# Patient Record
Sex: Male | Born: 1955
Health system: Southern US, Community
[De-identification: ages and names within clinical notes are randomized; demographics above are authoritative.]

## PROBLEM LIST (undated history)

## (undated) DIAGNOSIS — J449 Chronic obstructive pulmonary disease, unspecified: Secondary | ICD-10-CM

## (undated) DIAGNOSIS — E7800 Pure hypercholesterolemia, unspecified: Secondary | ICD-10-CM

## (undated) DIAGNOSIS — I10 Essential (primary) hypertension: Secondary | ICD-10-CM

## (undated) HISTORY — DX: Chronic obstructive pulmonary disease, unspecified: J44.9

## (undated) HISTORY — DX: Pure hypercholesterolemia, unspecified: E78.00

## (undated) HISTORY — PX: TONSILLECTOMY: SUR1361

## (undated) HISTORY — DX: Essential (primary) hypertension: I10

---

## 2012-06-07 ENCOUNTER — Institutional Professional Consult (permissible substitution): Payer: Self-pay | Admitting: Internal Medicine

## 2015-11-24 DIAGNOSIS — D122 Benign neoplasm of ascending colon: Secondary | ICD-10-CM | POA: Diagnosis not present

## 2015-11-24 DIAGNOSIS — Z86010 Personal history of colonic polyps: Secondary | ICD-10-CM | POA: Diagnosis not present

## 2015-11-24 DIAGNOSIS — K621 Rectal polyp: Secondary | ICD-10-CM | POA: Diagnosis not present

## 2016-08-12 DIAGNOSIS — Z720 Tobacco use: Secondary | ICD-10-CM | POA: Diagnosis not present

## 2016-08-12 DIAGNOSIS — H53133 Sudden visual loss, bilateral: Secondary | ICD-10-CM | POA: Diagnosis not present

## 2016-08-12 DIAGNOSIS — I10 Essential (primary) hypertension: Secondary | ICD-10-CM | POA: Diagnosis not present

## 2016-08-23 DIAGNOSIS — H53133 Sudden visual loss, bilateral: Secondary | ICD-10-CM | POA: Diagnosis not present

## 2016-08-23 DIAGNOSIS — I6522 Occlusion and stenosis of left carotid artery: Secondary | ICD-10-CM | POA: Diagnosis not present

## 2016-08-23 DIAGNOSIS — I6523 Occlusion and stenosis of bilateral carotid arteries: Secondary | ICD-10-CM | POA: Diagnosis not present

## 2016-08-24 DIAGNOSIS — Z0100 Encounter for examination of eyes and vision without abnormal findings: Secondary | ICD-10-CM | POA: Diagnosis not present

## 2016-09-12 DIAGNOSIS — I10 Essential (primary) hypertension: Secondary | ICD-10-CM | POA: Diagnosis not present

## 2016-09-12 DIAGNOSIS — Z720 Tobacco use: Secondary | ICD-10-CM | POA: Diagnosis not present

## 2016-09-12 DIAGNOSIS — G4700 Insomnia, unspecified: Secondary | ICD-10-CM | POA: Diagnosis not present

## 2017-05-08 DIAGNOSIS — F4320 Adjustment disorder, unspecified: Secondary | ICD-10-CM | POA: Diagnosis not present

## 2017-05-08 DIAGNOSIS — Z6821 Body mass index (BMI) 21.0-21.9, adult: Secondary | ICD-10-CM | POA: Diagnosis not present

## 2017-10-03 DIAGNOSIS — S300XXA Contusion of lower back and pelvis, initial encounter: Secondary | ICD-10-CM | POA: Diagnosis not present

## 2017-10-03 DIAGNOSIS — Z6821 Body mass index (BMI) 21.0-21.9, adult: Secondary | ICD-10-CM | POA: Diagnosis not present

## 2017-10-25 DIAGNOSIS — Z1211 Encounter for screening for malignant neoplasm of colon: Secondary | ICD-10-CM | POA: Diagnosis not present

## 2017-10-25 DIAGNOSIS — Z0000 Encounter for general adult medical examination without abnormal findings: Secondary | ICD-10-CM | POA: Diagnosis not present

## 2017-10-25 DIAGNOSIS — Z1339 Encounter for screening examination for other mental health and behavioral disorders: Secondary | ICD-10-CM | POA: Diagnosis not present

## 2017-10-25 DIAGNOSIS — Z6821 Body mass index (BMI) 21.0-21.9, adult: Secondary | ICD-10-CM | POA: Diagnosis not present

## 2017-10-25 DIAGNOSIS — Z13220 Encounter for screening for lipoid disorders: Secondary | ICD-10-CM | POA: Diagnosis not present

## 2017-10-25 DIAGNOSIS — Z125 Encounter for screening for malignant neoplasm of prostate: Secondary | ICD-10-CM | POA: Diagnosis not present

## 2017-10-25 DIAGNOSIS — Z720 Tobacco use: Secondary | ICD-10-CM | POA: Diagnosis not present

## 2018-02-08 DIAGNOSIS — S039XXA Sprain of joints and ligaments of unspecified parts of head, initial encounter: Secondary | ICD-10-CM | POA: Diagnosis not present

## 2018-02-08 DIAGNOSIS — S7011XD Contusion of right thigh, subsequent encounter: Secondary | ICD-10-CM | POA: Diagnosis not present

## 2018-04-02 DIAGNOSIS — R0600 Dyspnea, unspecified: Secondary | ICD-10-CM | POA: Diagnosis not present

## 2018-04-02 DIAGNOSIS — R918 Other nonspecific abnormal finding of lung field: Secondary | ICD-10-CM | POA: Diagnosis not present

## 2018-04-02 DIAGNOSIS — J439 Emphysema, unspecified: Secondary | ICD-10-CM | POA: Diagnosis not present

## 2018-05-04 DIAGNOSIS — J449 Chronic obstructive pulmonary disease, unspecified: Secondary | ICD-10-CM | POA: Diagnosis not present

## 2018-06-12 DIAGNOSIS — L259 Unspecified contact dermatitis, unspecified cause: Secondary | ICD-10-CM | POA: Diagnosis not present

## 2018-07-10 DIAGNOSIS — L300 Nummular dermatitis: Secondary | ICD-10-CM | POA: Diagnosis not present

## 2018-07-10 DIAGNOSIS — J449 Chronic obstructive pulmonary disease, unspecified: Secondary | ICD-10-CM | POA: Diagnosis not present

## 2018-07-10 DIAGNOSIS — Z6821 Body mass index (BMI) 21.0-21.9, adult: Secondary | ICD-10-CM | POA: Diagnosis not present

## 2018-07-10 DIAGNOSIS — M25551 Pain in right hip: Secondary | ICD-10-CM | POA: Diagnosis not present

## 2018-11-01 DIAGNOSIS — J449 Chronic obstructive pulmonary disease, unspecified: Secondary | ICD-10-CM | POA: Diagnosis not present

## 2018-11-01 DIAGNOSIS — Z125 Encounter for screening for malignant neoplasm of prostate: Secondary | ICD-10-CM | POA: Diagnosis not present

## 2018-11-01 DIAGNOSIS — I10 Essential (primary) hypertension: Secondary | ICD-10-CM | POA: Diagnosis not present

## 2018-11-01 DIAGNOSIS — Z79899 Other long term (current) drug therapy: Secondary | ICD-10-CM | POA: Diagnosis not present

## 2018-11-01 DIAGNOSIS — E7800 Pure hypercholesterolemia, unspecified: Secondary | ICD-10-CM | POA: Diagnosis not present

## 2018-11-01 DIAGNOSIS — Z6821 Body mass index (BMI) 21.0-21.9, adult: Secondary | ICD-10-CM | POA: Diagnosis not present

## 2019-03-11 DIAGNOSIS — I10 Essential (primary) hypertension: Secondary | ICD-10-CM | POA: Diagnosis not present

## 2019-03-11 DIAGNOSIS — R42 Dizziness and giddiness: Secondary | ICD-10-CM | POA: Diagnosis not present

## 2019-03-11 DIAGNOSIS — R634 Abnormal weight loss: Secondary | ICD-10-CM | POA: Diagnosis not present

## 2019-03-11 DIAGNOSIS — Z6820 Body mass index (BMI) 20.0-20.9, adult: Secondary | ICD-10-CM | POA: Diagnosis not present

## 2019-04-03 DIAGNOSIS — K5730 Diverticulosis of large intestine without perforation or abscess without bleeding: Secondary | ICD-10-CM | POA: Diagnosis not present

## 2019-04-09 DIAGNOSIS — Z86010 Personal history of colonic polyps: Secondary | ICD-10-CM | POA: Diagnosis not present

## 2019-04-09 DIAGNOSIS — D127 Benign neoplasm of rectosigmoid junction: Secondary | ICD-10-CM | POA: Diagnosis not present

## 2019-04-09 DIAGNOSIS — K635 Polyp of colon: Secondary | ICD-10-CM | POA: Diagnosis not present

## 2019-04-09 DIAGNOSIS — K621 Rectal polyp: Secondary | ICD-10-CM | POA: Diagnosis not present

## 2019-04-09 DIAGNOSIS — D124 Benign neoplasm of descending colon: Secondary | ICD-10-CM | POA: Diagnosis not present

## 2019-04-09 DIAGNOSIS — K5730 Diverticulosis of large intestine without perforation or abscess without bleeding: Secondary | ICD-10-CM | POA: Diagnosis not present

## 2019-04-09 DIAGNOSIS — K648 Other hemorrhoids: Secondary | ICD-10-CM | POA: Diagnosis not present

## 2019-04-09 DIAGNOSIS — F17210 Nicotine dependence, cigarettes, uncomplicated: Secondary | ICD-10-CM | POA: Diagnosis not present

## 2019-04-09 DIAGNOSIS — Z79899 Other long term (current) drug therapy: Secondary | ICD-10-CM | POA: Diagnosis not present

## 2019-06-17 DIAGNOSIS — H905 Unspecified sensorineural hearing loss: Secondary | ICD-10-CM | POA: Diagnosis not present

## 2019-06-17 DIAGNOSIS — H9190 Unspecified hearing loss, unspecified ear: Secondary | ICD-10-CM | POA: Diagnosis not present

## 2019-06-17 DIAGNOSIS — Z77122 Contact with and (suspected) exposure to noise: Secondary | ICD-10-CM | POA: Diagnosis not present

## 2019-06-17 DIAGNOSIS — H903 Sensorineural hearing loss, bilateral: Secondary | ICD-10-CM | POA: Diagnosis not present

## 2019-06-17 DIAGNOSIS — H9319 Tinnitus, unspecified ear: Secondary | ICD-10-CM | POA: Diagnosis not present

## 2019-07-15 DIAGNOSIS — H9319 Tinnitus, unspecified ear: Secondary | ICD-10-CM | POA: Diagnosis not present

## 2019-07-15 DIAGNOSIS — H903 Sensorineural hearing loss, bilateral: Secondary | ICD-10-CM | POA: Diagnosis not present

## 2019-07-15 DIAGNOSIS — Z77122 Contact with and (suspected) exposure to noise: Secondary | ICD-10-CM | POA: Diagnosis not present

## 2019-09-09 DIAGNOSIS — Z6821 Body mass index (BMI) 21.0-21.9, adult: Secondary | ICD-10-CM | POA: Diagnosis not present

## 2019-09-09 DIAGNOSIS — S92001A Unspecified fracture of right calcaneus, initial encounter for closed fracture: Secondary | ICD-10-CM | POA: Diagnosis not present

## 2019-09-10 DIAGNOSIS — S92014A Nondisplaced fracture of body of right calcaneus, initial encounter for closed fracture: Secondary | ICD-10-CM | POA: Diagnosis not present

## 2019-09-18 DIAGNOSIS — S92014D Nondisplaced fracture of body of right calcaneus, subsequent encounter for fracture with routine healing: Secondary | ICD-10-CM | POA: Diagnosis not present

## 2019-09-25 DIAGNOSIS — S92014D Nondisplaced fracture of body of right calcaneus, subsequent encounter for fracture with routine healing: Secondary | ICD-10-CM | POA: Diagnosis not present

## 2019-10-22 DIAGNOSIS — S92014D Nondisplaced fracture of body of right calcaneus, subsequent encounter for fracture with routine healing: Secondary | ICD-10-CM | POA: Diagnosis not present

## 2019-11-06 DIAGNOSIS — S92014D Nondisplaced fracture of body of right calcaneus, subsequent encounter for fracture with routine healing: Secondary | ICD-10-CM | POA: Diagnosis not present

## 2019-11-07 DIAGNOSIS — J449 Chronic obstructive pulmonary disease, unspecified: Secondary | ICD-10-CM | POA: Diagnosis not present

## 2019-11-07 DIAGNOSIS — Z125 Encounter for screening for malignant neoplasm of prostate: Secondary | ICD-10-CM | POA: Diagnosis not present

## 2019-11-07 DIAGNOSIS — I10 Essential (primary) hypertension: Secondary | ICD-10-CM | POA: Diagnosis not present

## 2019-11-07 DIAGNOSIS — Z0000 Encounter for general adult medical examination without abnormal findings: Secondary | ICD-10-CM | POA: Diagnosis not present

## 2019-11-07 DIAGNOSIS — Z13220 Encounter for screening for lipoid disorders: Secondary | ICD-10-CM | POA: Diagnosis not present

## 2019-11-07 DIAGNOSIS — E7800 Pure hypercholesterolemia, unspecified: Secondary | ICD-10-CM | POA: Diagnosis not present

## 2019-12-04 DIAGNOSIS — M722 Plantar fascial fibromatosis: Secondary | ICD-10-CM | POA: Diagnosis not present

## 2019-12-04 DIAGNOSIS — S92014D Nondisplaced fracture of body of right calcaneus, subsequent encounter for fracture with routine healing: Secondary | ICD-10-CM | POA: Diagnosis not present

## 2020-01-08 DIAGNOSIS — S92014D Nondisplaced fracture of body of right calcaneus, subsequent encounter for fracture with routine healing: Secondary | ICD-10-CM | POA: Diagnosis not present

## 2020-01-22 DIAGNOSIS — M722 Plantar fascial fibromatosis: Secondary | ICD-10-CM | POA: Diagnosis not present

## 2020-01-22 DIAGNOSIS — S92014D Nondisplaced fracture of body of right calcaneus, subsequent encounter for fracture with routine healing: Secondary | ICD-10-CM | POA: Diagnosis not present

## 2020-07-31 DIAGNOSIS — J069 Acute upper respiratory infection, unspecified: Secondary | ICD-10-CM | POA: Diagnosis not present

## 2020-08-27 DIAGNOSIS — I6529 Occlusion and stenosis of unspecified carotid artery: Secondary | ICD-10-CM | POA: Diagnosis not present

## 2020-08-27 DIAGNOSIS — I10 Essential (primary) hypertension: Secondary | ICD-10-CM | POA: Diagnosis not present

## 2020-08-27 DIAGNOSIS — S76012A Strain of muscle, fascia and tendon of left hip, initial encounter: Secondary | ICD-10-CM | POA: Diagnosis not present

## 2020-08-27 DIAGNOSIS — S76912A Strain of unspecified muscles, fascia and tendons at thigh level, left thigh, initial encounter: Secondary | ICD-10-CM | POA: Diagnosis not present

## 2020-09-01 DIAGNOSIS — I6529 Occlusion and stenosis of unspecified carotid artery: Secondary | ICD-10-CM | POA: Diagnosis not present

## 2020-09-04 DIAGNOSIS — Z20828 Contact with and (suspected) exposure to other viral communicable diseases: Secondary | ICD-10-CM | POA: Diagnosis not present

## 2020-09-04 DIAGNOSIS — J069 Acute upper respiratory infection, unspecified: Secondary | ICD-10-CM | POA: Diagnosis not present

## 2020-09-14 DIAGNOSIS — F4322 Adjustment disorder with anxiety: Secondary | ICD-10-CM | POA: Diagnosis not present

## 2020-09-14 DIAGNOSIS — Z720 Tobacco use: Secondary | ICD-10-CM | POA: Diagnosis not present

## 2020-09-14 DIAGNOSIS — I10 Essential (primary) hypertension: Secondary | ICD-10-CM | POA: Diagnosis not present

## 2020-09-14 DIAGNOSIS — F17210 Nicotine dependence, cigarettes, uncomplicated: Secondary | ICD-10-CM | POA: Diagnosis not present

## 2020-09-14 DIAGNOSIS — Z6821 Body mass index (BMI) 21.0-21.9, adult: Secondary | ICD-10-CM | POA: Diagnosis not present

## 2020-09-28 DIAGNOSIS — Z6821 Body mass index (BMI) 21.0-21.9, adult: Secondary | ICD-10-CM | POA: Diagnosis not present

## 2020-09-28 DIAGNOSIS — I10 Essential (primary) hypertension: Secondary | ICD-10-CM | POA: Diagnosis not present

## 2020-10-03 DIAGNOSIS — F17210 Nicotine dependence, cigarettes, uncomplicated: Secondary | ICD-10-CM | POA: Diagnosis not present

## 2020-10-03 DIAGNOSIS — R0602 Shortness of breath: Secondary | ICD-10-CM | POA: Diagnosis not present

## 2020-10-03 DIAGNOSIS — J439 Emphysema, unspecified: Secondary | ICD-10-CM | POA: Diagnosis not present

## 2020-10-03 DIAGNOSIS — J9811 Atelectasis: Secondary | ICD-10-CM | POA: Diagnosis not present

## 2020-10-09 DIAGNOSIS — Z20822 Contact with and (suspected) exposure to covid-19: Secondary | ICD-10-CM | POA: Diagnosis not present

## 2020-10-09 DIAGNOSIS — I10 Essential (primary) hypertension: Secondary | ICD-10-CM | POA: Diagnosis not present

## 2020-10-09 DIAGNOSIS — T50905D Adverse effect of unspecified drugs, medicaments and biological substances, subsequent encounter: Secondary | ICD-10-CM | POA: Diagnosis not present

## 2020-10-09 DIAGNOSIS — J9811 Atelectasis: Secondary | ICD-10-CM | POA: Diagnosis not present

## 2020-10-09 DIAGNOSIS — J322 Chronic ethmoidal sinusitis: Secondary | ICD-10-CM | POA: Diagnosis not present

## 2020-10-09 DIAGNOSIS — R410 Disorientation, unspecified: Secondary | ICD-10-CM | POA: Diagnosis not present

## 2020-10-09 DIAGNOSIS — I6782 Cerebral ischemia: Secondary | ICD-10-CM | POA: Diagnosis not present

## 2020-10-09 DIAGNOSIS — J320 Chronic maxillary sinusitis: Secondary | ICD-10-CM | POA: Diagnosis not present

## 2020-10-09 DIAGNOSIS — R4182 Altered mental status, unspecified: Secondary | ICD-10-CM | POA: Diagnosis not present

## 2020-10-12 DIAGNOSIS — R410 Disorientation, unspecified: Secondary | ICD-10-CM | POA: Diagnosis not present

## 2020-10-12 DIAGNOSIS — Z6821 Body mass index (BMI) 21.0-21.9, adult: Secondary | ICD-10-CM | POA: Diagnosis not present

## 2020-10-12 DIAGNOSIS — I10 Essential (primary) hypertension: Secondary | ICD-10-CM | POA: Diagnosis not present

## 2020-10-12 DIAGNOSIS — J449 Chronic obstructive pulmonary disease, unspecified: Secondary | ICD-10-CM | POA: Diagnosis not present

## 2020-10-13 ENCOUNTER — Encounter: Payer: Self-pay | Admitting: Cardiology

## 2020-10-13 DIAGNOSIS — E7800 Pure hypercholesterolemia, unspecified: Secondary | ICD-10-CM

## 2020-10-13 DIAGNOSIS — I10 Essential (primary) hypertension: Secondary | ICD-10-CM

## 2020-10-13 DIAGNOSIS — J449 Chronic obstructive pulmonary disease, unspecified: Secondary | ICD-10-CM | POA: Insufficient documentation

## 2020-10-21 DIAGNOSIS — J449 Chronic obstructive pulmonary disease, unspecified: Secondary | ICD-10-CM | POA: Diagnosis not present

## 2020-10-21 DIAGNOSIS — F17210 Nicotine dependence, cigarettes, uncomplicated: Secondary | ICD-10-CM | POA: Diagnosis not present

## 2020-10-29 ENCOUNTER — Encounter: Payer: Self-pay | Admitting: Cardiology

## 2020-10-29 ENCOUNTER — Other Ambulatory Visit: Payer: Self-pay

## 2020-10-29 ENCOUNTER — Ambulatory Visit (INDEPENDENT_AMBULATORY_CARE_PROVIDER_SITE_OTHER): Payer: BLUE CROSS/BLUE SHIELD | Admitting: Cardiology

## 2020-10-29 VITALS — BP 124/80 | HR 63 | Ht 70.5 in | Wt 154.0 lb

## 2020-10-29 DIAGNOSIS — Z720 Tobacco use: Secondary | ICD-10-CM

## 2020-10-29 DIAGNOSIS — R9431 Abnormal electrocardiogram [ECG] [EKG]: Secondary | ICD-10-CM

## 2020-10-29 DIAGNOSIS — R0609 Other forms of dyspnea: Secondary | ICD-10-CM | POA: Insufficient documentation

## 2020-10-29 DIAGNOSIS — I10 Essential (primary) hypertension: Secondary | ICD-10-CM | POA: Insufficient documentation

## 2020-10-29 DIAGNOSIS — R0600 Dyspnea, unspecified: Secondary | ICD-10-CM | POA: Diagnosis not present

## 2020-10-29 HISTORY — DX: Tobacco use: Z72.0

## 2020-10-29 HISTORY — DX: Other forms of dyspnea: R06.09

## 2020-10-29 HISTORY — DX: Essential (primary) hypertension: I10

## 2020-10-29 HISTORY — DX: Abnormal electrocardiogram (ECG) (EKG): R94.31

## 2020-10-29 NOTE — Patient Instructions (Signed)
Medication Instructions:  Your physician recommends that you continue on your current medications as directed. Please refer to the Current Medication list given to you today.  *If you need a refill on your cardiac medications before your next appointment, please call your pharmacy*   Lab Work: None If you have labs (blood work) drawn today and your tests are completely normal, you will receive your results only by: Marland Kitchen MyChart Message (if you have MyChart) OR . A paper copy in the mail If you have any lab test that is abnormal or we need to change your treatment, we will call you to review the results.   Testing/Procedures: Your physician has requested that you have an echocardiogram. Echocardiography is a painless test that uses sound waves to create images of your heart. It provides your doctor with information about the size and shape of your heart and how well your heart's chambers and valves are working. This procedure takes approximately one hour. There are no restrictions for this procedure.  Your physician has requested that you have a lexiscan myoview. For further information please visit [email or web address] Please follow instruction sheet, as given.    Follow-Up: At [facility], you and your health needs are our priority.  As part of our continuing mission to provide you with exceptional heart care, we have created designated Provider Care Teams.  These Care Teams include your primary Cardiologist (physician) and Advanced Practice Providers (APPs -  Physician Assistants and Nurse Practitioners) who all work together to provide you with the care you need, when you need it.  We recommend signing up for the patient portal called "MyChart".  Sign up information is provided on this After Visit Summary.  MyChart is used to connect with patients for Virtual Visits (Telemedicine).  Patients are able to view lab/test results, encounter notes, upcoming appointments, etc.  Non-urgent  messages can be sent to your provider as well.   To learn more about what you can do with MyChart, go to [email or web address].    Your next appointment:   3 month(s)  The format for your next appointment:   In Person  Provider:   Thomasene Ripple, DO   Other Instructions  Cardiac Nuclear Scan A cardiac nuclear scan is a test that is done to check the flow of blood to your heart. It is done when you are resting and when you are exercising. The test looks for problems such as:  Not enough blood reaching a portion of the heart.  The heart muscle not working as it should. You may need this test if:  You have heart disease.  You have had lab results that are not normal.  You have had heart surgery or a balloon procedure to open up blocked arteries (angioplasty).  You have chest pain.  You have shortness of breath. In this test, a special dye (tracer) is put into your bloodstream. The tracer will travel to your heart. A camera will then take pictures of your heart to see how the tracer moves through your heart. This test is usually done at a hospital and takes 2-4 hours. Tell a doctor about:  Any allergies you have.  All medicines you are taking, including vitamins, herbs, eye drops, creams, and over-the-counter medicines.  Any problems you or family members have had with anesthetic medicines.  Any blood disorders you have.  Any surgeries you have had.  Any medical conditions you have.  Whether you are pregnant or may be pregnant. What are the risks?  Generally, this is a safe test. However, problems may occur, such as:  Serious chest pain and heart attack. This is only a risk if the stress portion of the test is done.  Rapid heartbeat.  A feeling of warmth in your chest. This feeling usually does not last long.  Allergic reaction to the tracer. What happens before the test?  Ask your doctor about changing or stopping your normal medicines. This is  important.  Follow instructions from your doctor about what you cannot eat or drink.  Remove your jewelry on the day of the test. What happens during the test?  An IV tube will be inserted into one of your veins.  Your doctor will give you a small amount of tracer through the IV tube.  You will wait for 20-40 minutes while the tracer moves through your bloodstream.  Your heart will be monitored with an electrocardiogram (ECG).  You will lie down on an exam table.  Pictures of your heart will be taken for about 15-20 minutes.  You may also have a stress test. For this test, one of these things may be done: ? You will be asked to exercise on a treadmill or a stationary bike. ? You will be given medicines that will make your heart work harder. This is done if you are unable to exercise.  When blood flow to your heart has peaked, a tracer will again be given through the IV tube.  After 20-40 minutes, you will get back on the exam table. More pictures will be taken of your heart.  Depending on the tracer that is used, more pictures may need to be taken 3-4 hours later.  Your IV tube will be removed when the test is over. The test may vary among doctors and hospitals. What happens after the test?  Ask your doctor: ? Whether you can return to your normal schedule, including diet, activities, and medicines. ? Whether you should drink more fluids. This will help to remove the tracer from your body. Drink enough fluid to keep your pee (urine) pale yellow.  Ask your doctor, or the department that is doing the test: ? When will my results be ready? ? How will I get my results? Summary  A cardiac nuclear scan is a test that is done to check the flow of blood to your heart.  Tell your doctor whether you are pregnant or may be pregnant.  Before the test, ask your doctor about changing or stopping your normal medicines. This is important.  Ask your doctor whether you can return to  your normal activities. You may be asked to drink more fluids. This information is not intended to replace advice given to you by your health care provider. Make sure you discuss any questions you have with your health care provider. Document Revised: 11/28/2018 Document Reviewed: 01/22/2018 Elsevier Patient Education  2021 [employer]  Echocardiogram An echocardiogram is a test that uses sound waves (ultrasound) to produce images of the heart. Images from an echocardiogram can provide important information about:  Heart size and shape.  The size and thickness and movement of your heart's walls.  Heart muscle function and strength.  Heart valve function or if you have stenosis. Stenosis is when the heart valves are too narrow.  If blood is flowing backward through the heart valves (regurgitation).  A tumor or infectious growth around the heart valves.  Areas of heart muscle that are not working well because of poor blood flow  or injury from a heart attack.  Aneurysm detection. An aneurysm is a weak or damaged part of an artery wall. The wall bulges out from the normal force of blood pumping through the body. Tell a health care provider about:  Any allergies you have.  All medicines you are taking, including vitamins, herbs, eye drops, creams, and over-the-counter medicines.  Any blood disorders you have.  Any surgeries you have had.  Any medical conditions you have.  Whether you are pregnant or may be pregnant. What are the risks? Generally, this is a safe test. However, problems may occur, including an allergic reaction to dye (contrast) that may be used during the test. What happens before the test? No specific preparation is needed. You may eat and drink normally. What happens during the test?  You will take off your clothes from the waist up and put on a hospital gown.  Electrodes or electrocardiogram (ECG)patches may be placed on your chest. The electrodes or  patches are then connected to a device that monitors your heart rate and rhythm.  You will lie down on a table for an ultrasound exam. A gel will be applied to your chest to help sound waves pass through your skin.  A handheld device, called a transducer, will be pressed against your chest and moved over your heart. The transducer produces sound waves that travel to your heart and bounce back (or "echo" back) to the transducer. These sound waves will be captured in real-time and changed into images of your heart that can be viewed on a video monitor. The images will be recorded on a computer and reviewed by your health care provider.  You may be asked to change positions or hold your breath for a short time. This makes it easier to get different views or better views of your heart.  In some cases, you may receive contrast through an IV in one of your veins. This can improve the quality of the pictures from your heart. The procedure may vary among health care providers and hospitals.   What can I expect after the test? You may return to your normal, everyday life, including diet, activities, and medicines, unless your health care provider tells you not to do that. Follow these instructions at home:  It is up to you to get the results of your test. Ask your health care provider, or the department that is doing the test, when your results will be ready.  Keep all follow-up visits. This is important. Summary  An echocardiogram is a test that uses sound waves (ultrasound) to produce images of the heart.  Images from an echocardiogram can provide important information about the size and shape of your heart, heart muscle function, heart valve function, and other possible heart problems.  You do not need to do anything to prepare before this test. You may eat and drink normally.  After the echocardiogram is completed, you may return to your normal, everyday life, unless your health care provider  tells you not to do that. This information is not intended to replace advice given to you by your health care provider. Make sure you discuss any questions you have with your health care provider. Document Revised: 03/31/2020 Document Reviewed: 03/31/2020 Elsevier Patient Education  2021 [employer].

## 2020-10-29 NOTE — Progress Notes (Signed)
Cardiology Office Note:    Date:  10/29/2020   ID:  [name], DOB [birth year], MRN [id number]  PCP:  Lise Auer, MD  Cardiologist:  Thomasene Ripple, DO  Electrophysiologist:  None   Referring MD: Lise Auer, MD   Chief Complaint  Patient presents with  . Hypertension   History of Present Illness:    Stephen [name] is a 65 y.o. male with a hx of hypertension, hyperlipidemia, tobacco use, recently diagnosed COPD is here today to be evaluated for shortness of breath and exertion.  The patient presented at the request of his PCP Dr. Welton Flakes.  He tells me that over the last several months he has had worsening shortness of breath on exertion.  He denies any chest pain.  But is concerned given his father having successful coronary artery bypass grafting in his 38s.  He has not had any syncope episode he has not had lightheadedness or dizziness.  Past Medical History:  Diagnosis Date  . Benign essential hypertension   . COPD (chronic obstructive pulmonary disease) (HCC)   . Elevated LDL cholesterol level     Past Surgical History:  Procedure Laterality Date  . TONSILLECTOMY      Current Medications: Current Meds  Medication Sig  . albuterol (VENTOLIN HFA) 108 (90 Base) MCG/ACT inhaler Inhale 1 puff into the lungs as needed for wheezing or shortness of breath.  Marland Kitchen atorvastatin (LIPITOR) 10 MG tablet Take 10 mg by mouth daily.  . bisoprolol-hydrochlorothiazide (ZIAC) 5-6.25 MG tablet Take 1 tablet by mouth daily.  . celecoxib (CELEBREX) 200 MG capsule Take 200 mg by mouth daily as needed for moderate pain (Arthritis pain).  . Fluticasone-Umeclidin-Vilant (TRELEGY ELLIPTA) 100-62.5-25 MCG/INH AEPB Inhale 1 puff into the lungs in the morning.  Marland Kitchen ibuprofen (ADVIL) 200 MG tablet Take 400 mg by mouth every 6 (six) hours as needed for mild pain.  Marland Kitchen lisinopril (ZESTRIL) 10 MG tablet Take 10 mg by mouth at bedtime.     Allergies:   Patient has no known allergies.   Social History    Socioeconomic History  . Marital status: Single    Spouse name: Not on file  . Number of children: Not on file  . Years of education: Not on file  . Highest education level: Not on file  Occupational History  . Not on file  Tobacco Use  . Smoking status: Current Every Day Smoker  . Smokeless tobacco: Never Used  Substance and Sexual Activity  . Alcohol use: Yes    Alcohol/week: 14.0 standard drinks    Types: 14 Glasses of wine per week  . Drug use: Never  . Sexual activity: Not on file  Other Topics Concern  . Not on file  Social History Narrative  . Not on file   Social Determinants of Health   Financial Resource Strain: Not on file  Food Insecurity: Not on file  Transportation Needs: Not on file  Physical Activity: Not on file  Stress: Not on file  Social Connections: Not on file     Family History: The patient's family history includes Alzheimer's disease in his mother; CAD in his father; Cancer in his brother.  ROS:   Review of Systems  Constitution: Negative for decreased appetite, fever and weight gain.  HENT: Negative for congestion, ear discharge, hoarse voice and sore throat.   Eyes: Negative for discharge, redness, vision loss in right eye and visual halos.  Cardiovascular: Negative for chest pain, dyspnea on exertion, leg swelling,  orthopnea and palpitations.  Respiratory: Negative for cough, hemoptysis, shortness of breath and snoring.   Endocrine: Negative for heat intolerance and polyphagia.  Hematologic/Lymphatic: Negative for bleeding problem. Does not bruise/bleed easily.  Skin: Negative for flushing, nail changes, rash and suspicious lesions.  Musculoskeletal: Negative for arthritis, joint pain, muscle cramps, myalgias, neck pain and stiffness.  Gastrointestinal: Negative for abdominal pain, bowel incontinence, diarrhea and excessive appetite.  Genitourinary: Negative for decreased libido, genital sores and incomplete emptying.  Neurological:  Negative for brief paralysis, focal weakness, headaches and loss of balance.  Psychiatric/Behavioral: Negative for altered mental status, depression and suicidal ideas.  Allergic/Immunologic: Negative for HIV exposure and persistent infections.    EKGs/Labs/Other Studies Reviewed:    The following studies were reviewed today:   EKG:  The ekg ordered today demonstrates sinus rhythm, heart rate 63 bpm with P wave morphology suggesting left atrial enlargement and precordial leads suggesting septal wall infarction of age indeterminate.  Recent Labs: No results found for requested labs within last 8760 hours.  Recent Lipid Panel No results found for: CHOL, TRIG, HDL, CHOLHDL, VLDL, LDLCALC, LDLDIRECT  Physical Exam:    VS:  BP 124/80 (BP Location: Left Arm, Patient Position: Sitting, Cuff Size: Normal)   Pulse 63   Ht 5' 10.5" (1.791 m)   Wt 154 lb (69.9 kg)   SpO2 96%   BMI 21.78 kg/m     Wt Readings from Last 3 Encounters:  10/29/20 154 lb (69.9 kg)  10/12/20 152 lb (68.9 kg)     GEN: Well nourished, well developed in no acute distress HEENT: Normal NECK: No JVD; No carotid bruits LYMPHATICS: No lymphadenopathy CARDIAC: S1S2 noted,RRR, no murmurs, rubs, gallops RESPIRATORY:  Clear to auscultation without rales, wheezing or rhonchi  ABDOMEN: Soft, non-tender, non-distended, +bowel sounds, no guarding. EXTREMITIES: No edema, No cyanosis, no clubbing MUSCULOSKELETAL:  No deformity  SKIN: Warm and dry NEUROLOGIC:  Alert and oriented x 3, non-focal PSYCHIATRIC:  Normal affect, good insight  ASSESSMENT:    1. DOE (dyspnea on exertion)   2. Hypertension, unspecified type   3. Abnormal EKG   4. Tobacco use    PLAN:     His EKG abnormal and he has had dyspnea on exertion with his risk factor like to pursue an ischemic evaluation in this patient.  Ideally a coronary CTA would be the best test for this patient but he has declined this as he does not want to drive to  [facility] we will move on with getting a pharmacologic nuclear stress test at this time.  Have educated patient about this testing.  His blood pressure is acceptable no changes will be made to his antihypertensive regimen.  For completeness with a disinsertion and his current smoker I like to make sure that right heart failure is not playing a role here and get an echocardiogram to assess for diastolic dysfunction or any other structural abnormalities.  Smoking cessation advised   Continue his statin 10 mg Lipitor, his lipid profile back in March 2021 showed triglyceride 55, HDL 69, total cholesterol 830, LDL 95.  The patient is in agreement with the above plan. The patient left the office in stable condition.  The patient will follow up in 3 months or sooner if needed.   Medication Adjustments/Labs and Tests Ordered: Current medicines are reviewed at length with the patient today.  Concerns regarding medicines are outlined above.  Orders Placed This Encounter  Procedures  . MYOCARDIAL PERFUSION IMAGING  . EKG 12-Lead  .  ECHOCARDIOGRAM COMPLETE   No orders of the defined types were placed in this encounter.   Patient Instructions   Medication Instructions:  Your physician recommends that you continue on your current medications as directed. Please refer to the Current Medication list given to you today.  *If you need a refill on your cardiac medications before your next appointment, please call your pharmacy*   Lab Work: None If you have labs (blood work) drawn today and your tests are completely normal, you will receive your results only by: Marland Kitchen MyChart Message (if you have MyChart) OR . A paper copy in the mail If you have any lab test that is abnormal or we need to change your treatment, we will call you to review the results.   Testing/Procedures: Your physician has requested that you have an echocardiogram. Echocardiography is a painless test that uses sound waves to  create images of your heart. It provides your doctor with information about the size and shape of your heart and how well your heart's chambers and valves are working. This procedure takes approximately one hour. There are no restrictions for this procedure.  Your physician has requested that you have a lexiscan myoview. For further information please visit [email or web address] Please follow instruction sheet, as given.    Follow-Up: At [facility], you and your health needs are our priority.  As part of our continuing mission to provide you with exceptional heart care, we have created designated Provider Care Teams.  These Care Teams include your primary Cardiologist (physician) and Advanced Practice Providers (APPs -  Physician Assistants and Nurse Practitioners) who all work together to provide you with the care you need, when you need it.  We recommend signing up for the patient portal called "MyChart".  Sign up information is provided on this After Visit Summary.  MyChart is used to connect with patients for Virtual Visits (Telemedicine).  Patients are able to view lab/test results, encounter notes, upcoming appointments, etc.  Non-urgent messages can be sent to your provider as well.   To learn more about what you can do with MyChart, go to [email or web address].    Your next appointment:   3 month(s)  The format for your next appointment:   In Person  Provider:   Thomasene Ripple, DO   Other Instructions  Cardiac Nuclear Scan A cardiac nuclear scan is a test that is done to check the flow of blood to your heart. It is done when you are resting and when you are exercising. The test looks for problems such as:  Not enough blood reaching a portion of the heart.  The heart muscle not working as it should. You may need this test if:  You have heart disease.  You have had lab results that are not normal.  You have had heart surgery or a balloon procedure to open up blocked  arteries (angioplasty).  You have chest pain.  You have shortness of breath. In this test, a special dye (tracer) is put into your bloodstream. The tracer will travel to your heart. A camera will then take pictures of your heart to see how the tracer moves through your heart. This test is usually done at a hospital and takes 2-4 hours. Tell a doctor about:  Any allergies you have.  All medicines you are taking, including vitamins, herbs, eye drops, creams, and over-the-counter medicines.  Any problems you or family members have had with anesthetic medicines.  Any blood disorders you have.  Any surgeries  you have had.  Any medical conditions you have.  Whether you are pregnant or may be pregnant. What are the risks? Generally, this is a safe test. However, problems may occur, such as:  Serious chest pain and heart attack. This is only a risk if the stress portion of the test is done.  Rapid heartbeat.  A feeling of warmth in your chest. This feeling usually does not last long.  Allergic reaction to the tracer. What happens before the test?  Ask your doctor about changing or stopping your normal medicines. This is important.  Follow instructions from your doctor about what you cannot eat or drink.  Remove your jewelry on the day of the test. What happens during the test?  An IV tube will be inserted into one of your veins.  Your doctor will give you a small amount of tracer through the IV tube.  You will wait for 20-40 minutes while the tracer moves through your bloodstream.  Your heart will be monitored with an electrocardiogram (ECG).  You will lie down on an exam table.  Pictures of your heart will be taken for about 15-20 minutes.  You may also have a stress test. For this test, one of these things may be done: ? You will be asked to exercise on a treadmill or a stationary bike. ? You will be given medicines that will make your heart work harder. This is done  if you are unable to exercise.  When blood flow to your heart has peaked, a tracer will again be given through the IV tube.  After 20-40 minutes, you will get back on the exam table. More pictures will be taken of your heart.  Depending on the tracer that is used, more pictures may need to be taken 3-4 hours later.  Your IV tube will be removed when the test is over. The test may vary among doctors and hospitals. What happens after the test?  Ask your doctor: ? Whether you can return to your normal schedule, including diet, activities, and medicines. ? Whether you should drink more fluids. This will help to remove the tracer from your body. Drink enough fluid to keep your pee (urine) pale yellow.  Ask your doctor, or the department that is doing the test: ? When will my results be ready? ? How will I get my results? Summary  A cardiac nuclear scan is a test that is done to check the flow of blood to your heart.  Tell your doctor whether you are pregnant or may be pregnant.  Before the test, ask your doctor about changing or stopping your normal medicines. This is important.  Ask your doctor whether you can return to your normal activities. You may be asked to drink more fluids. This information is not intended to replace advice given to you by your health care provider. Make sure you discuss any questions you have with your health care provider. Document Revised: 11/28/2018 Document Reviewed: 01/22/2018 Elsevier Patient Education  2021 Elsevier Inc.  Echocardiogram An echocardiogram is a test that uses sound waves (ultrasound) to produce images of the heart. Images from an echocardiogram can provide important information about:  Heart size and shape.  The size and thickness and movement of your heart's walls.  Heart muscle function and strength.  Heart valve function or if you have stenosis. Stenosis is when the heart valves are too narrow.  If blood is flowing backward  through the heart valves (regurgitation).  A tumor  or infectious growth around the heart valves.  Areas of heart muscle that are not working well because of poor blood flow or injury from a heart attack.  Aneurysm detection. An aneurysm is a weak or damaged part of an artery wall. The wall bulges out from the normal force of blood pumping through the body. Tell a health care provider about:  Any allergies you have.  All medicines you are taking, including vitamins, herbs, eye drops, creams, and over-the-counter medicines.  Any blood disorders you have.  Any surgeries you have had.  Any medical conditions you have.  Whether you are pregnant or may be pregnant. What are the risks? Generally, this is a safe test. However, problems may occur, including an allergic reaction to dye (contrast) that may be used during the test. What happens before the test? No specific preparation is needed. You may eat and drink normally. What happens during the test?  You will take off your clothes from the waist up and put on a hospital gown.  Electrodes or electrocardiogram (ECG)patches may be placed on your chest. The electrodes or patches are then connected to a device that monitors your heart rate and rhythm.  You will lie down on a table for an ultrasound exam. A gel will be applied to your chest to help sound waves pass through your skin.  A handheld device, called a transducer, will be pressed against your chest and moved over your heart. The transducer produces sound waves that travel to your heart and bounce back (or "echo" back) to the transducer. These sound waves will be captured in real-time and changed into images of your heart that can be viewed on a video monitor. The images will be recorded on a computer and reviewed by your health care provider.  You may be asked to change positions or hold your breath for a short time. This makes it easier to get different views or better views of your  heart.  In some cases, you may receive contrast through an IV in one of your veins. This can improve the quality of the pictures from your heart. The procedure may vary among health care providers and hospitals.   What can I expect after the test? You may return to your normal, everyday life, including diet, activities, and medicines, unless your health care provider tells you not to do that. Follow these instructions at home:  It is up to you to get the results of your test. Ask your health care provider, or the department that is doing the test, when your results will be ready.  Keep all follow-up visits. This is important. Summary  An echocardiogram is a test that uses sound waves (ultrasound) to produce images of the heart.  Images from an echocardiogram can provide important information about the size and shape of your heart, heart muscle function, heart valve function, and other possible heart problems.  You do not need to do anything to prepare before this test. You may eat and drink normally.  After the echocardiogram is completed, you may return to your normal, everyday life, unless your health care provider tells you not to do that. This information is not intended to replace advice given to you by your health care provider. Make sure you discuss any questions you have with your health care provider. Document Revised: 03/31/2020 Document Reviewed: 03/31/2020 Elsevier Patient Education  2021 Elsevier Inc.      Adopting a Healthy Lifestyle.  Know what a healthy weight  is for you (roughly BMI <25) and aim to maintain this   Aim for 7+ servings of fruits and vegetables daily   65-80+ fluid ounces of water or unsweet tea for healthy kidneys   Limit to max 1 drink of alcohol per day; avoid smoking/tobacco   Limit animal fats in diet for cholesterol and heart health - choose grass fed whenever available   Avoid highly processed foods, and foods high in saturated/trans  fats   Aim for low stress - take time to unwind and care for your mental health   Aim for 150 min of moderate intensity exercise weekly for heart health, and weights twice weekly for bone health   Aim for 7-9 hours of sleep daily   When it comes to diets, agreement about the perfect plan isnt easy to find, even among the experts. Experts at the [employer] developed an idea known as the Healthy Eating Plate. Just imagine a plate divided into logical, healthy portions.   The emphasis is on diet quality:   Load up on vegetables and fruits - one-half of your plate: Aim for color and variety, and remember that potatoes dont count.   Go for whole grains - one-quarter of your plate: Whole wheat, barley, wheat berries, quinoa, oats, brown rice, and foods made with them. If you want pasta, go with whole wheat pasta.   Protein power - one-quarter of your plate: Fish, chicken, beans, and nuts are all healthy, versatile protein sources. Limit red meat.   The diet, however, does go beyond the plate, offering a few other suggestions.   Use healthy plant oils, such as olive, canola, soy, corn, sunflower and peanut. Check the labels, and avoid partially hydrogenated oil, which have unhealthy trans fats.   If youre thirsty, drink water. Coffee and tea are good in moderation, but skip sugary drinks and limit milk and dairy products to one or two daily servings.   The type of carbohydrate in the diet is more important than the amount. Some sources of carbohydrates, such as vegetables, fruits, whole grains, and beans-are healthier than others.   Finally, stay active  Signed, Thomasene RippleKardie Mckenzee Beem, DO  10/29/2020 9:24 AM    [facility]

## 2020-11-05 NOTE — Addendum Note (Signed)
Addended by: Thomasene Ripple on: 11/05/2020 02:25 PM   Modules accepted: Orders

## 2020-11-10 ENCOUNTER — Telehealth (HOSPITAL_COMMUNITY): Payer: Self-pay | Admitting: *Deleted

## 2020-11-10 NOTE — Telephone Encounter (Signed)
Patient given detailed instructions per Myocardial Perfusion Study Information Sheet for the test on 11/11/20. Patient notified to arrive 15 minutes early and that it is imperative to arrive on time for appointment to keep from having the test rescheduled.  If you need to cancel or reschedule your appointment, please call the office within 24 hours of your appointment. . Patient verbalized understanding. Ricky Ala

## 2020-11-11 ENCOUNTER — Other Ambulatory Visit: Payer: Self-pay

## 2020-11-11 ENCOUNTER — Ambulatory Visit (INDEPENDENT_AMBULATORY_CARE_PROVIDER_SITE_OTHER): Payer: BLUE CROSS/BLUE SHIELD

## 2020-11-11 DIAGNOSIS — R0600 Dyspnea, unspecified: Secondary | ICD-10-CM

## 2020-11-11 DIAGNOSIS — R9431 Abnormal electrocardiogram [ECG] [EKG]: Secondary | ICD-10-CM

## 2020-11-11 DIAGNOSIS — R0609 Other forms of dyspnea: Secondary | ICD-10-CM

## 2020-11-11 LAB — MYOCARDIAL PERFUSION IMAGING
LV dias vol: 122 mL (ref 62–150)
LV sys vol: 56 mL
Peak HR: 89 {beats}/min
Rest HR: 60 {beats}/min
SDS: 3
SRS: 4
SSS: 7
TID: 1

## 2020-11-11 LAB — ECHOCARDIOGRAM COMPLETE
Area-P 1/2: 2.34 cm2
Calc EF: 46.5 %
P 1/2 time: 676 msec
S' Lateral: 4.15 cm
Single Plane A2C EF: 45.7 %
Single Plane A4C EF: 45.5 %

## 2020-11-11 MED ORDER — TECHNETIUM TC 99M TETROFOSMIN IV KIT
29.1000 | PACK | Freq: Once | INTRAVENOUS | Status: AC | PRN
  Administered 2020-11-11: 29.1 via INTRAVENOUS

## 2020-11-11 MED ORDER — TECHNETIUM TC 99M TETROFOSMIN IV KIT
10.6000 | PACK | Freq: Once | INTRAVENOUS | Status: AC | PRN
  Administered 2020-11-11: 10.6 via INTRAVENOUS

## 2020-11-11 MED ORDER — REGADENOSON 0.4 MG/5ML IV SOLN
0.4000 mg | Freq: Once | INTRAVENOUS | Status: AC
  Administered 2020-11-11: 0.4 mg via INTRAVENOUS

## 2020-11-11 NOTE — Progress Notes (Signed)
Complete echocardiogram performed.  Jimmy Rishika Mccollom RDCS, RVT  

## 2020-11-12 DIAGNOSIS — Z13220 Encounter for screening for lipoid disorders: Secondary | ICD-10-CM | POA: Diagnosis not present

## 2020-11-12 DIAGNOSIS — Z6821 Body mass index (BMI) 21.0-21.9, adult: Secondary | ICD-10-CM | POA: Diagnosis not present

## 2020-11-12 DIAGNOSIS — J449 Chronic obstructive pulmonary disease, unspecified: Secondary | ICD-10-CM | POA: Diagnosis not present

## 2020-11-12 DIAGNOSIS — Z1331 Encounter for screening for depression: Secondary | ICD-10-CM | POA: Diagnosis not present

## 2020-11-12 DIAGNOSIS — Z125 Encounter for screening for malignant neoplasm of prostate: Secondary | ICD-10-CM | POA: Diagnosis not present

## 2020-11-12 DIAGNOSIS — Z0000 Encounter for general adult medical examination without abnormal findings: Secondary | ICD-10-CM | POA: Diagnosis not present

## 2020-11-16 ENCOUNTER — Other Ambulatory Visit: Payer: Self-pay

## 2020-11-16 DIAGNOSIS — J449 Chronic obstructive pulmonary disease, unspecified: Secondary | ICD-10-CM | POA: Diagnosis not present

## 2020-11-16 DIAGNOSIS — I77810 Thoracic aortic ectasia: Secondary | ICD-10-CM

## 2020-11-16 DIAGNOSIS — F17210 Nicotine dependence, cigarettes, uncomplicated: Secondary | ICD-10-CM | POA: Diagnosis not present

## 2020-11-30 ENCOUNTER — Other Ambulatory Visit: Payer: Self-pay

## 2020-11-30 ENCOUNTER — Ambulatory Visit (HOSPITAL_BASED_OUTPATIENT_CLINIC_OR_DEPARTMENT_OTHER): Admission: RE | Admit: 2020-11-30 | Payer: BLUE CROSS/BLUE SHIELD | Source: Ambulatory Visit

## 2020-11-30 DIAGNOSIS — I77810 Thoracic aortic ectasia: Secondary | ICD-10-CM

## 2020-11-30 LAB — BASIC METABOLIC PANEL
BUN/Creatinine Ratio: 14 (ref 10–24)
BUN: 11 mg/dL (ref 8–27)
CO2: 23 mmol/L (ref 20–29)
Calcium: 9.3 mg/dL (ref 8.6–10.2)
Chloride: 93 mmol/L — ABNORMAL LOW (ref 96–106)
Creatinine, Ser: 0.76 mg/dL (ref 0.76–1.27)
Glucose: 83 mg/dL (ref 65–99)
Potassium: 4.9 mmol/L (ref 3.5–5.2)
Sodium: 135 mmol/L (ref 134–144)
eGFR: 100 mL/min/{1.73_m2} (ref >59)

## 2020-11-30 LAB — MAGNESIUM: Magnesium: 1.8 mg/dL (ref 1.6–2.3)

## 2020-12-08 ENCOUNTER — Ambulatory Visit (HOSPITAL_BASED_OUTPATIENT_CLINIC_OR_DEPARTMENT_OTHER)
Admission: RE | Admit: 2020-12-08 | Discharge: 2020-12-08 | Disposition: A | Discharge status: Home / Self Care | Payer: BLUE CROSS/BLUE SHIELD | Source: Ambulatory Visit | Attending: Cardiology | Admitting: Cardiology

## 2020-12-08 ENCOUNTER — Encounter (HOSPITAL_BASED_OUTPATIENT_CLINIC_OR_DEPARTMENT_OTHER): Payer: Self-pay

## 2020-12-08 ENCOUNTER — Other Ambulatory Visit: Payer: Self-pay

## 2020-12-08 DIAGNOSIS — I77810 Thoracic aortic ectasia: Secondary | ICD-10-CM | POA: Diagnosis not present

## 2020-12-08 DIAGNOSIS — R0600 Dyspnea, unspecified: Secondary | ICD-10-CM | POA: Diagnosis not present

## 2020-12-08 DIAGNOSIS — R0609 Other forms of dyspnea: Secondary | ICD-10-CM | POA: Diagnosis not present

## 2020-12-08 MED ORDER — IOHEXOL 350 MG/ML SOLN
100.0000 mL | Freq: Once | INTRAVENOUS | Status: AC | PRN
  Administered 2020-12-08: 100 mL via INTRAVENOUS

## 2020-12-27 DIAGNOSIS — J9601 Acute respiratory failure with hypoxia: Secondary | ICD-10-CM | POA: Diagnosis not present

## 2020-12-27 DIAGNOSIS — J441 Chronic obstructive pulmonary disease with (acute) exacerbation: Secondary | ICD-10-CM | POA: Diagnosis not present

## 2020-12-27 DIAGNOSIS — Z79899 Other long term (current) drug therapy: Secondary | ICD-10-CM | POA: Diagnosis not present

## 2020-12-27 DIAGNOSIS — J439 Emphysema, unspecified: Secondary | ICD-10-CM | POA: Diagnosis not present

## 2020-12-27 DIAGNOSIS — E785 Hyperlipidemia, unspecified: Secondary | ICD-10-CM | POA: Diagnosis not present

## 2020-12-27 DIAGNOSIS — J440 Chronic obstructive pulmonary disease with acute lower respiratory infection: Secondary | ICD-10-CM | POA: Diagnosis not present

## 2020-12-27 DIAGNOSIS — I10 Essential (primary) hypertension: Secondary | ICD-10-CM | POA: Diagnosis not present

## 2020-12-27 DIAGNOSIS — R0602 Shortness of breath: Secondary | ICD-10-CM | POA: Diagnosis not present

## 2020-12-27 DIAGNOSIS — F17210 Nicotine dependence, cigarettes, uncomplicated: Secondary | ICD-10-CM | POA: Diagnosis not present

## 2020-12-27 DIAGNOSIS — J209 Acute bronchitis, unspecified: Secondary | ICD-10-CM | POA: Diagnosis not present

## 2020-12-27 DIAGNOSIS — M19011 Primary osteoarthritis, right shoulder: Secondary | ICD-10-CM | POA: Diagnosis not present

## 2020-12-28 DIAGNOSIS — J441 Chronic obstructive pulmonary disease with (acute) exacerbation: Secondary | ICD-10-CM | POA: Diagnosis not present

## 2020-12-28 DIAGNOSIS — J9601 Acute respiratory failure with hypoxia: Secondary | ICD-10-CM | POA: Diagnosis not present

## 2020-12-28 DIAGNOSIS — J440 Chronic obstructive pulmonary disease with acute lower respiratory infection: Secondary | ICD-10-CM | POA: Diagnosis not present

## 2020-12-28 DIAGNOSIS — J449 Chronic obstructive pulmonary disease, unspecified: Secondary | ICD-10-CM | POA: Diagnosis not present

## 2021-01-20 DIAGNOSIS — L723 Sebaceous cyst: Secondary | ICD-10-CM | POA: Diagnosis not present

## 2021-01-28 DIAGNOSIS — J449 Chronic obstructive pulmonary disease, unspecified: Secondary | ICD-10-CM | POA: Diagnosis not present

## 2021-02-11 ENCOUNTER — Other Ambulatory Visit: Payer: Self-pay

## 2021-02-15 ENCOUNTER — Ambulatory Visit (INDEPENDENT_AMBULATORY_CARE_PROVIDER_SITE_OTHER): Payer: BLUE CROSS/BLUE SHIELD | Admitting: Cardiology

## 2021-02-15 ENCOUNTER — Other Ambulatory Visit: Payer: Self-pay

## 2021-02-15 ENCOUNTER — Encounter: Payer: Self-pay | Admitting: Cardiology

## 2021-02-15 VITALS — BP 158/90 | HR 85 | Ht 70.0 in | Wt 147.2 lb

## 2021-02-15 DIAGNOSIS — Z720 Tobacco use: Secondary | ICD-10-CM

## 2021-02-15 DIAGNOSIS — I10 Essential (primary) hypertension: Secondary | ICD-10-CM | POA: Diagnosis not present

## 2021-02-15 MED ORDER — LISINOPRIL 20 MG PO TABS: 20.0000 mg | ORAL_TABLET | Freq: Every evening | ORAL | 3 refills | Status: DC

## 2021-02-15 NOTE — Patient Instructions (Signed)
Medication Instructions:   Your physician has recommended you make the following change in your medication:  INCREASE: Lisinopril 20 mg at bedtime.   *If you need a refill on your cardiac medications before your next appointment, please call your pharmacy*   Lab Work:  None If you have labs (blood work) drawn today and your tests are completely normal, you will receive your results only by: MyChart Message (if you have MyChart) OR A paper copy in the mail If you have any lab test that is abnormal or we need to change your treatment, we will call you to review the results.   Testing/Procedures:  None   Follow-Up: At [facility], you and your health needs are our priority.  As part of our continuing mission to provide you with exceptional heart care, we have created designated Provider Care Teams.  These Care Teams include your primary Cardiologist (physician) and Advanced Practice Providers (APPs -  Physician Assistants and Nurse Practitioners) who all work together to provide you with the care you need, when you need it.  We recommend signing up for the patient portal called "MyChart".  Sign up information is provided on this After Visit Summary.  MyChart is used to connect with patients for Virtual Visits (Telemedicine).  Patients are able to view lab/test results, encounter notes, upcoming appointments, etc.  Non-urgent messages can be sent to your provider as well.   To learn more about what you can do with MyChart, go to [email or web address].    Your next appointment:   12 month(s)  The format for your next appointment:   In Person    Other Instructions

## 2021-02-15 NOTE — Progress Notes (Signed)
Cardiology Office Note:    Date:  02/15/2021   ID:  Stephen [name], DOB 1956/07/28, MRN [id number]  PCP:  Lise Auer, MD  Cardiologist:  Thomasene Ripple, DO  Electrophysiologist:  None   Referring MD: Lise Auer, MD   No chief complaint on file. -Doing fine  History of Present Illness:    Stephen [name] is a 65 y.o. male with a hx of hypertension, hyperlipidemia, tobacco use is here today for follow-up visit.  Last saw the patient on October 30, 2019 at that time he presented to be evaluated for shortness of breath.  Given his family history and risk factors I will send the patient for coronary CTA but he declined and subsequently underwent a nuclear stress test.  The nuclear stress test was reported to be normal.  However his echocardiogram did show some evidence of dilatation of the aorta and we confirmed this with the CT of the chest which showed no evidence of dilatation of the aortic root and ascending aorta.  Today he offers no complaints.   Past Medical History:  Diagnosis Date   Benign essential hypertension    COPD (chronic obstructive pulmonary disease) (HCC)    Elevated LDL cholesterol level     Past Surgical History:  Procedure Laterality Date   TONSILLECTOMY      Current Medications: Current Meds  Medication Sig   atorvastatin (LIPITOR) 10 MG tablet Take 10 mg by mouth daily.   bisoprolol-hydrochlorothiazide (ZIAC) 5-6.25 MG tablet Take 1 tablet by mouth daily.   celecoxib (CELEBREX) 200 MG capsule Take 200 mg by mouth daily as needed for moderate pain (Arthritis pain).   ibuprofen (ADVIL) 200 MG tablet Take 400 mg by mouth every 6 (six) hours as needed for mild pain.   lisinopril (ZESTRIL) 20 MG tablet Take 1 tablet (20 mg total) by mouth at bedtime.   OXYGEN Inhale 4 L into the lungs at bedtime.   STIOLTO RESPIMAT 2.5-2.5 MCG/ACT AERS Inhale 2 puffs into the lungs daily.   [DISCONTINUED] albuterol (VENTOLIN HFA) 108 (90 Base) MCG/ACT inhaler Inhale 1 puff into  the lungs as needed for wheezing or shortness of breath.   [DISCONTINUED] Fluticasone-Umeclidin-Vilant (TRELEGY ELLIPTA) 100-62.5-25 MCG/INH AEPB Inhale 1 puff into the lungs in the morning.   [DISCONTINUED] lisinopril (ZESTRIL) 10 MG tablet Take 10 mg by mouth at bedtime.     Allergies:   Patient has no known allergies.   Social History   Socioeconomic History   Marital status: Single    Spouse name: Not on file   Number of children: Not on file   Years of education: Not on file   Highest education level: Not on file  Occupational History   Not on file  Tobacco Use   Smoking status: Every Day    Pack years: 0.00   Smokeless tobacco: Never  Substance and Sexual Activity   Alcohol use: Yes    Alcohol/week: 14.0 standard drinks    Types: 14 Glasses of wine per week   Drug use: Never   Sexual activity: Not on file  Other Topics Concern   Not on file  Social History Narrative   Not on file   Social Determinants of Health   Financial Resource Strain: Not on file  Food Insecurity: Not on file  Transportation Needs: Not on file  Physical Activity: Not on file  Stress: Not on file  Social Connections: Not on file     Family History: The patient's family history includes  Alzheimer's disease in his mother; CAD in his father; Cancer in his brother.  ROS:   Review of Systems  Constitution: Negative for decreased appetite, fever and weight gain.  HENT: Negative for congestion, ear discharge, hoarse voice and sore throat.   Eyes: Negative for discharge, redness, vision loss in right eye and visual halos.  Cardiovascular: Negative for chest pain, dyspnea on exertion, leg swelling, orthopnea and palpitations.  Respiratory: Negative for cough, hemoptysis, shortness of breath and snoring.   Endocrine: Negative for heat intolerance and polyphagia.  Hematologic/Lymphatic: Negative for bleeding problem. Does not bruise/bleed easily.  Skin: Negative for flushing, nail changes, rash  and suspicious lesions.  Musculoskeletal: Negative for arthritis, joint pain, muscle cramps, myalgias, neck pain and stiffness.  Gastrointestinal: Negative for abdominal pain, bowel incontinence, diarrhea and excessive appetite.  Genitourinary: Negative for decreased libido, genital sores and incomplete emptying.  Neurological: Negative for brief paralysis, focal weakness, headaches and loss of balance.  Psychiatric/Behavioral: Negative for altered mental status, depression and suicidal ideas.  Allergic/Immunologic: Negative for HIV exposure and persistent infections.    EKGs/Labs/Other Studies Reviewed:    The following studies were reviewed today:   EKG: None today  April 2022 FINDINGS: Cardiovascular: The heart is unremarkable without pericardial effusion. There is no evidence of thoracic aortic aneurysm or dissection. Maximal diameter of the ascending aorta measures 3.4 cm, of the aortic arch measures 2.7 cm, and of the descending thoracic aorta measures 2.7 cm. There is mild atherosclerosis of the aortic arch and within the origins of the great vessels. Mild atherosclerosis within the LAD and circumflex distribution of the coronary vasculature.   Mediastinum/Nodes: No enlarged mediastinal, hilar, or axillary lymph nodes. Thyroid gland, trachea, and esophagus demonstrate no significant findings.   Lungs/Pleura: Upper lobe predominant bullous emphysematous changes are again noted, right greater than left. No acute airspace disease, effusion, or pneumothorax. The central airways are widely patent.   Upper Abdomen: No acute abnormality.   Musculoskeletal: There are no acute or destructive bony lesions. Reconstructed images demonstrate no additional findings.   Review of the MIP images confirms the above findings.   IMPRESSION: 1. No evidence of thoracic aortic aneurysm or dissection. Maximal diameter of the ascending thoracic aorta just above the sino-tubular junction  measures only 3.4 cm. 2. Upper lobe predominant bullous emphysematous changes, stable. 3. No acute intrathoracic process. 4. Aortic Atherosclerosis (ICD10-I70.0). Coronary artery atherosclerosis.    Pharmacologic stress test March 2022 The left ventricular ejection fraction is mildly decreased (45-54%). Nuclear stress EF: 54%. There was no ST segment deviation noted during stress. Defect 1: There is a small defect of mild severity present in the apical inferior location. No evidenc eof ischemia or MI. Fixed defect involving apical portion of the inferior wall represents attenuation.  Transthoracic echocardiogram March 2022 IMPRESSIONS   1. Left ventricular ejection fraction, by estimation, is 50 to 55%. The  left ventricle has low normal function. The left ventricle has no regional  wall motion abnormalities. There is mild left ventricular hypertrophy.  Left ventricular diastolic  parameters were normal.   2. Right ventricular systolic function is normal. The right ventricular  size is normal. There is mildly elevated pulmonary artery systolic  pressure.   3. The mitral valve is normal in structure. No evidence of mitral valve  regurgitation. No evidence of mitral stenosis.   4. Tricuspid valve regurgitation is mild to moderate.   5. The aortic valve is normal in structure. There is mild calcification  of the aortic  valve. There is mild thickening of the aortic valve. Aortic  valve regurgitation is mild. No aortic stenosis is present.   6. Dimension at the level of coronary sinus - 44mm.   7. The inferior vena cava is normal in size with greater than 50%  respiratory variability, suggesting right atrial pressure of 3 mmHg.   FINDINGS   Left Ventricle: Left ventricular ejection fraction, by estimation, is 50  to 55%. The left ventricle has low normal function. The left ventricle has  no regional wall motion abnormalities. The left ventricular internal  cavity size was normal in  size.  There is mild left ventricular hypertrophy. Left ventricular diastolic  parameters were normal.   Right Ventricle: The right ventricular size is normal. No increase in  right ventricular wall thickness. Right ventricular systolic function is  normal. There is mildly elevated pulmonary artery systolic pressure. The  tricuspid regurgitant velocity is 2.81   m/s, and with an assumed right atrial pressure of 8 mmHg, the estimated  right ventricular systolic pressure is 39.6 mmHg.   Left Atrium: Left atrial size was normal in size.   Right Atrium: Right atrial size was normal in size.   Pericardium: There is no evidence of pericardial effusion.   Mitral Valve: The mitral valve is normal in structure. No evidence of  mitral valve regurgitation. No evidence of mitral valve stenosis.   Tricuspid Valve: The tricuspid valve is normal in structure. Tricuspid  valve regurgitation is mild to moderate. No evidence of tricuspid  stenosis.   Aortic Valve: The aortic valve is normal in structure. There is mild  calcification of the aortic valve. There is mild thickening of the aortic  valve. Aortic valve regurgitation is mild. Aortic regurgitation PHT  measures 676 msec. No aortic stenosis is  present.   Pulmonic Valve: The pulmonic valve was normal in structure. Pulmonic valve  regurgitation is not visualized. No evidence of pulmonic stenosis.   Aorta: Dimension at the level of coronary sinus - 44mm. The aortic root is  normal in size and structure.   Venous: The inferior vena cava is normal in size with greater than 50%  respiratory variability, suggesting right atrial pressure of 3 mmHg.   IAS/Shunts: No atrial level shunt detected by color flow Doppler.      Recent Labs: 11/30/2020: BUN 11; Creatinine, Ser 0.76; Magnesium 1.8; Potassium 4.9; Sodium 135  Recent Lipid Panel No results found for: CHOL, TRIG, HDL, CHOLHDL, VLDL, LDLCALC, LDLDIRECT  Physical Exam:    VS:  BP (!)  158/90   Pulse 85   Ht  (1.778 m)   Wt 147 lb 3.2 oz (66.8 kg)   SpO2 93%   BMI 21.12 kg/m     Wt Readings from Last 3 Encounters:  02/15/21 147 lb 3.2 oz (66.8 kg)  11/11/20 154 lb (69.9 kg)  10/29/20 154 lb (69.9 kg)     GEN: Well nourished, well developed in no acute distress HEENT: Normal NECK: No JVD; No carotid bruits LYMPHATICS: No lymphadenopathy CARDIAC: S1S2 noted,RRR, no murmurs, rubs, gallops RESPIRATORY:  Clear to auscultation without rales, wheezing or rhonchi  ABDOMEN: Soft, non-tender, non-distended, +bowel sounds, no guarding. EXTREMITIES: No edema, No cyanosis, no clubbing MUSCULOSKELETAL:  No deformity  SKIN: Warm and dry NEUROLOGIC:  Alert and oriented x 3, non-focal PSYCHIATRIC:  Normal affect, good insight  ASSESSMENT:    1. Benign essential hypertension   2. Tobacco use    PLAN:     His blood  pressure is elevated in the office today.  I am going to increase his lisinopril to 20 mg at bedtime and he will continue his other antihypertensive medication which includes bisoprolol- hydrochlorothiazide 5-6.25 mg daily.  His shortness of breath has improved he has been using his inhalers for COPD.    He is working on quitting smoking.  The patient is in agreement with the above plan. The patient left the office in stable condition.  Follow-up is recommended for 12 months.  He prefers to follow another provider in [place].  Medication Adjustments/Labs and Tests Ordered: Current medicines are reviewed at length with the patient today.  Concerns regarding medicines are outlined above.  No orders of the defined types were placed in this encounter.  Meds ordered this encounter  Medications   lisinopril (ZESTRIL) 20 MG tablet    Sig: Take 1 tablet (20 mg total) by mouth at bedtime.    Dispense:  90 tablet    Refill:  3     Patient Instructions  Medication Instructions:   Your physician has recommended you make the following change in your  medication:  INCREASE: Lisinopril 20 mg at bedtime.   *If you need a refill on your cardiac medications before your next appointment, please call your pharmacy*   Lab Work:  None If you have labs (blood work) drawn today and your tests are completely normal, you will receive your results only by: MyChart Message (if you have MyChart) OR A paper copy in the mail If you have any lab test that is abnormal or we need to change your treatment, we will call you to review the results.   Testing/Procedures:  None   Follow-Up: At [facility], you and your health needs are our priority.  As part of our continuing mission to provide you with exceptional heart care, we have created designated Provider Care Teams.  These Care Teams include your primary Cardiologist (physician) and Advanced Practice Providers (APPs -  Physician Assistants and Nurse Practitioners) who all work together to provide you with the care you need, when you need it.  We recommend signing up for the patient portal called "MyChart".  Sign up information is provided on this After Visit Summary.  MyChart is used to connect with patients for Virtual Visits (Telemedicine).  Patients are able to view lab/test results, encounter notes, upcoming appointments, etc.  Non-urgent messages can be sent to your provider as well.   To learn more about what you can do with MyChart, go to [email or web address].    Your next appointment:   12 month(s)  The format for your next appointment:   In Person    Other Instructions    Adopting a Healthy Lifestyle.  Know what a healthy weight is for you (roughly BMI <25) and aim to maintain this   Aim for 7+ servings of fruits and vegetables daily   65-80+ fluid ounces of water or unsweet tea for healthy kidneys   Limit to max 1 drink of alcohol per day; avoid smoking/tobacco   Limit animal fats in diet for cholesterol and heart health - choose grass fed whenever available   Avoid  highly processed foods, and foods high in saturated/trans fats   Aim for low stress - take time to unwind and care for your mental health   Aim for 150 min of moderate intensity exercise weekly for heart health, and weights twice weekly for bone health   Aim for 7-9 hours of sleep daily  When it comes to diets, agreement about the perfect plan isnt easy to find, even among the experts. Experts at the [facility] [employer] developed an idea known as the Healthy Eating Plate. Just imagine a plate divided into logical, healthy portions.   The emphasis is on diet quality:   Load up on vegetables and fruits - one-half of your plate: Aim for color and variety, and remember that potatoes dont count.   Go for whole grains - one-quarter of your plate: Whole wheat, barley, wheat berries, quinoa, oats, brown rice, and foods made with them. If you want pasta, go with whole wheat pasta.   Protein power - one-quarter of your plate: Fish, chicken, beans, and nuts are all healthy, versatile protein sources. Limit red meat.   The diet, however, does go beyond the plate, offering a few other suggestions.   Use healthy plant oils, such as olive, canola, soy, corn, sunflower and peanut. Check the labels, and avoid partially hydrogenated oil, which have unhealthy trans fats.   If youre thirsty, drink water. Coffee and tea are good in moderation, but skip sugary drinks and limit milk and dairy products to one or two daily servings.   The type of carbohydrate in the diet is more important than the amount. Some sources of carbohydrates, such as vegetables, fruits, whole grains, and beans-are healthier than others.   Finally, stay active  Signed, Thomasene Ripple, DO  02/15/2021 9:06 AM    [facility]

## 2021-02-27 DIAGNOSIS — J449 Chronic obstructive pulmonary disease, unspecified: Secondary | ICD-10-CM | POA: Diagnosis not present

## 2021-03-08 DIAGNOSIS — K4090 Unilateral inguinal hernia, without obstruction or gangrene, not specified as recurrent: Secondary | ICD-10-CM | POA: Diagnosis not present

## 2021-03-08 DIAGNOSIS — Z6820 Body mass index (BMI) 20.0-20.9, adult: Secondary | ICD-10-CM | POA: Diagnosis not present

## 2021-03-30 DIAGNOSIS — K4090 Unilateral inguinal hernia, without obstruction or gangrene, not specified as recurrent: Secondary | ICD-10-CM | POA: Diagnosis not present

## 2021-03-30 DIAGNOSIS — J449 Chronic obstructive pulmonary disease, unspecified: Secondary | ICD-10-CM | POA: Diagnosis not present

## 2021-04-13 DIAGNOSIS — Z6820 Body mass index (BMI) 20.0-20.9, adult: Secondary | ICD-10-CM | POA: Diagnosis not present

## 2021-04-13 DIAGNOSIS — J449 Chronic obstructive pulmonary disease, unspecified: Secondary | ICD-10-CM | POA: Diagnosis not present

## 2021-04-13 DIAGNOSIS — Z01818 Encounter for other preprocedural examination: Secondary | ICD-10-CM | POA: Diagnosis not present

## 2021-04-30 DIAGNOSIS — J449 Chronic obstructive pulmonary disease, unspecified: Secondary | ICD-10-CM | POA: Diagnosis not present

## 2021-05-10 DIAGNOSIS — J449 Chronic obstructive pulmonary disease, unspecified: Secondary | ICD-10-CM | POA: Diagnosis not present

## 2021-05-10 DIAGNOSIS — F17210 Nicotine dependence, cigarettes, uncomplicated: Secondary | ICD-10-CM | POA: Diagnosis not present

## 2021-05-10 DIAGNOSIS — K4090 Unilateral inguinal hernia, without obstruction or gangrene, not specified as recurrent: Secondary | ICD-10-CM | POA: Diagnosis not present

## 2021-11-14 IMAGING — CT CT ANGIO CHEST
2 of 6 series · 18 of 46 positions shown · IV contrast (Omnipaque)
Comparison: 05/30/2012

CLINICAL DATA: Dyspnea on exertion, abnormal EKG, dilation of
aortic root

EXAM:
CT ANGIOGRAPHY CHEST WITH CONTRAST
TECHNIQUE: Multidetector CT imaging of the chest was performed using the
standard protocol during bolus administration of intravenous
contrast. Multiplanar CT image reconstructions and MIPs were
obtained to evaluate the vascular anatomy.
CONTRAST:  100mL OMNIPAQUE IOHEXOL 350 MG/ML SOLN

[Series 4: axial arterial · axial · arterial · 0.72mm/px · z∈[-332,-26]mm · 15 of 115 slices shown]
[im 7/115  lung]
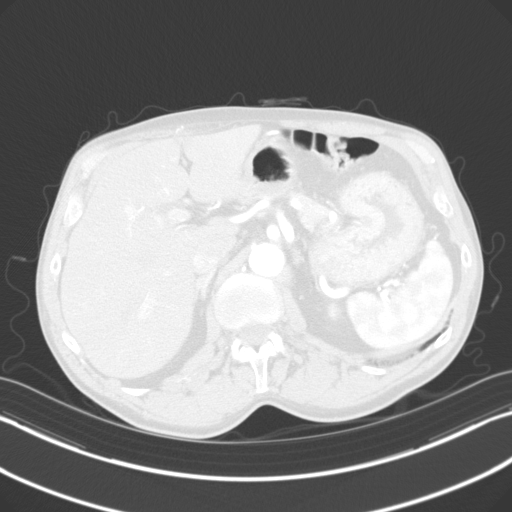
[im 13/115  soft-tissue]
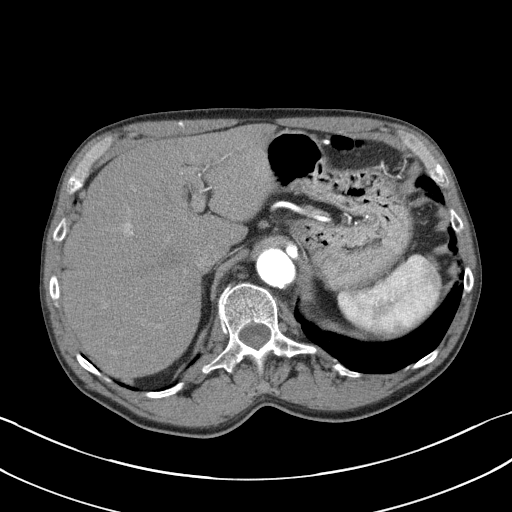
[im 25/115  lung]
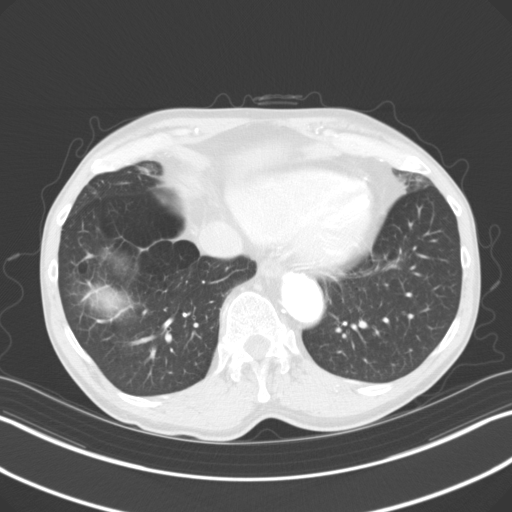
[im 31/115  soft-tissue]
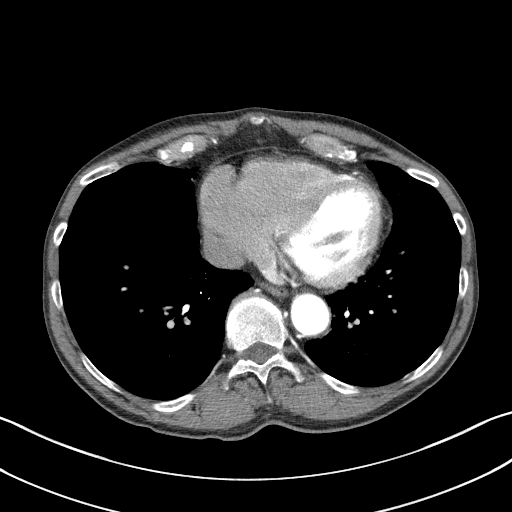
[im 37/115  lung]
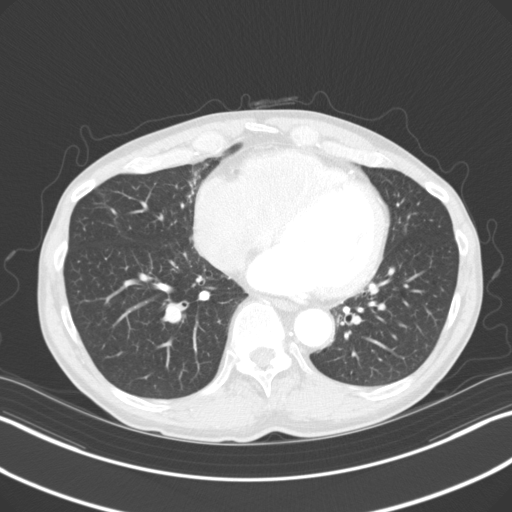
[im 43/115  soft-tissue]
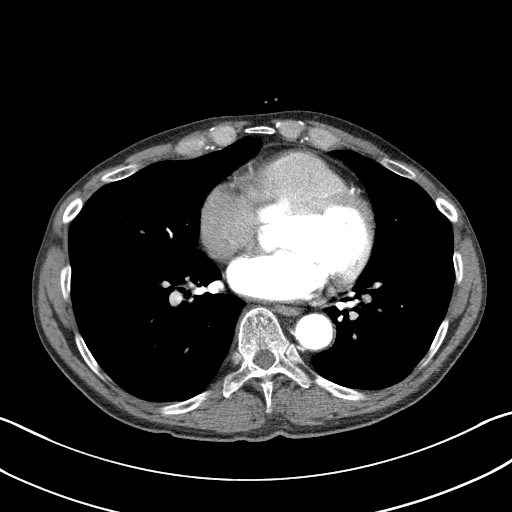
[im 49/115  lung]
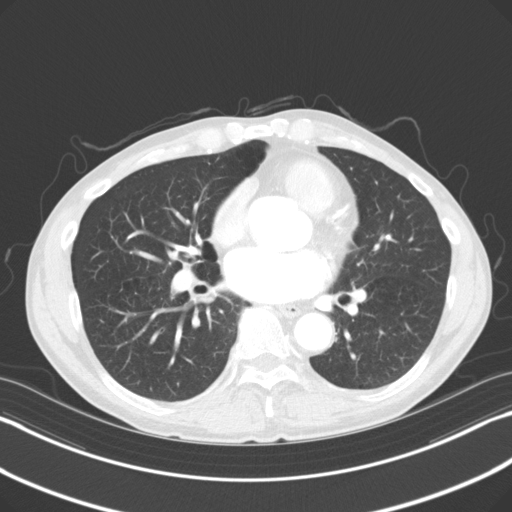
[im 61/115  soft-tissue]
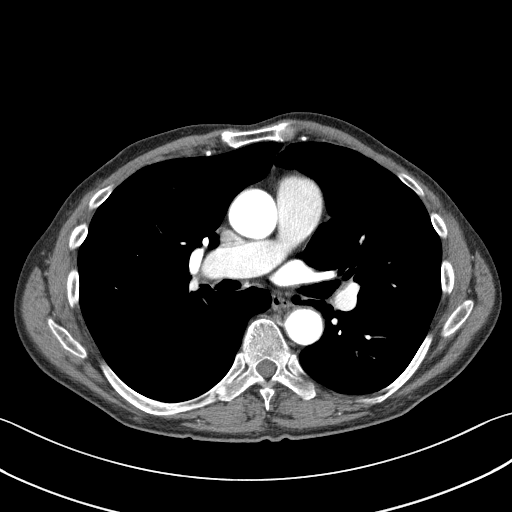
[im 67/115  lung]
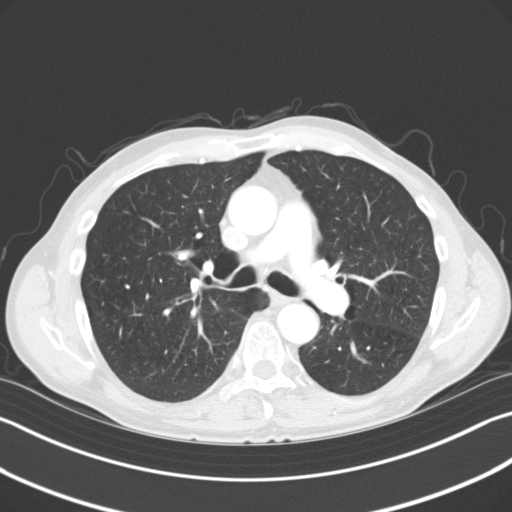
[im 73/115  soft-tissue]
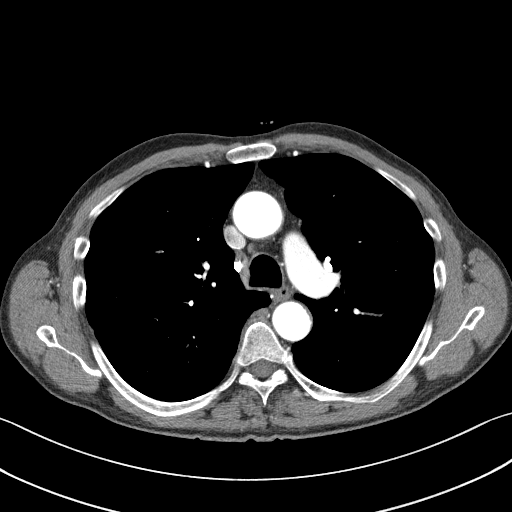
[im 79/115  lung]
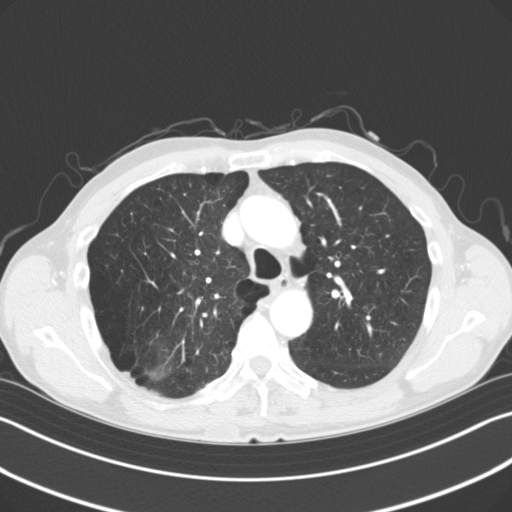
[im 85/115  soft-tissue]
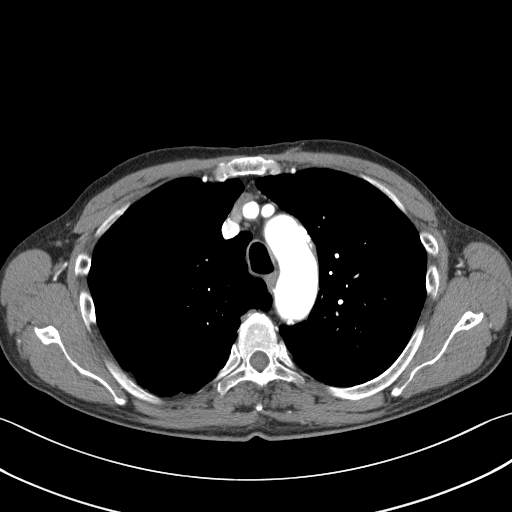
[im 97/115  lung]
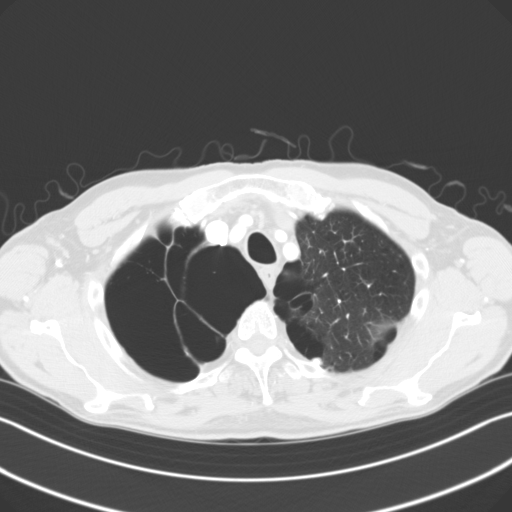
[im 103/115  soft-tissue]
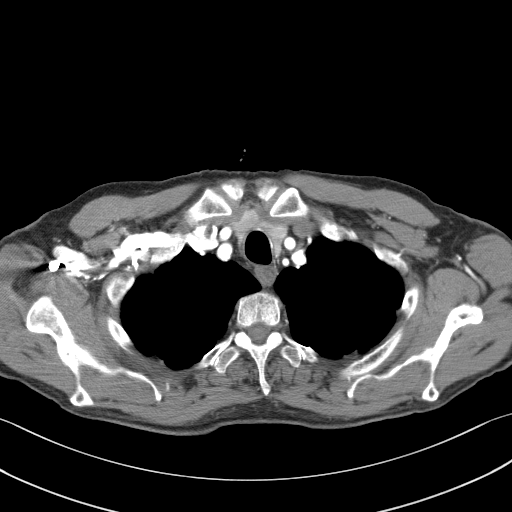
[im 109/115  lung]
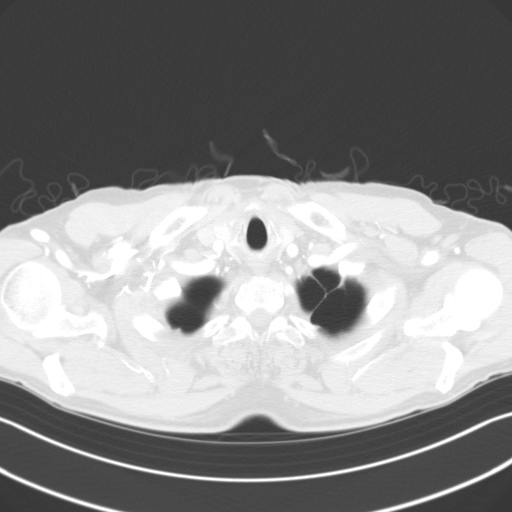

[Series 7: coronal · coronal · 0.70mm/px · 3 of 81 slices shown]
[im 21/81  soft-tissue]
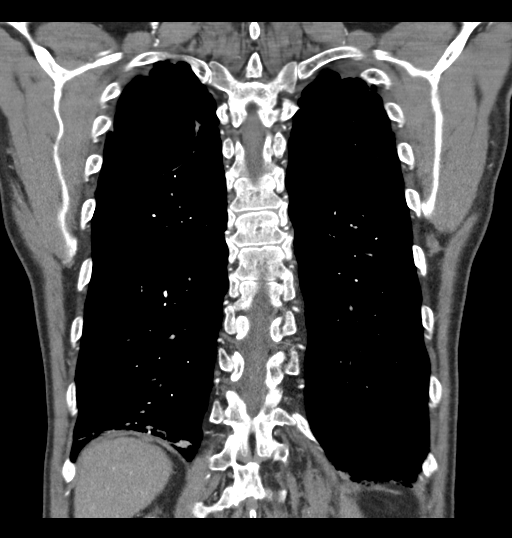
[im 41/81  soft-tissue]
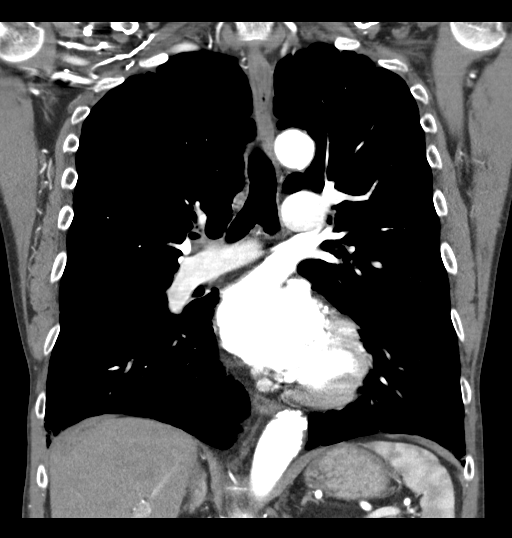
[im 61/81  soft-tissue]
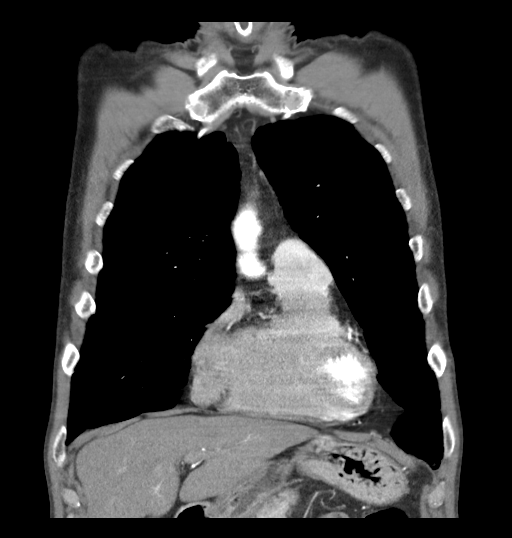

[18 of 46 positions shown; findings below may reference images not displayed]

FINDINGS: Cardiovascular: The heart is unremarkable without pericardial
effusion. There is no evidence of thoracic aortic aneurysm or
dissection. Maximal diameter of the ascending aorta measures 3.4 cm,
of the aortic arch measures 2.7 cm, and of the descending thoracic
aorta measures 2.7 cm. There is mild atherosclerosis of the aortic
arch and within the origins of the great vessels. Mild
atherosclerosis within the LAD and circumflex distribution of the
coronary vasculature.

Mediastinum/Nodes: No enlarged mediastinal, hilar, or axillary lymph
nodes. Thyroid gland, trachea, and esophagus demonstrate no
significant findings.

Lungs/Pleura: Upper lobe predominant bullous emphysematous changes
are again noted, right greater than left. No acute airspace disease,
effusion, or pneumothorax. The central airways are widely patent.

Upper Abdomen: No acute abnormality.

Musculoskeletal: There are no acute or destructive bony lesions.
Reconstructed images demonstrate no additional findings.

Review of the MIP images confirms the above findings.
IMPRESSION: 1. No evidence of thoracic aortic aneurysm or dissection. Maximal
diameter of the ascending thoracic aorta just above the Peri
junction measures only 3.4 cm.
2. Upper lobe predominant bullous emphysematous changes, stable.
3. No acute intrathoracic process.
4. Aortic Atherosclerosis ([id number]). Coronary artery
atherosclerosis.

## 2021-11-15 DIAGNOSIS — Z125 Encounter for screening for malignant neoplasm of prostate: Secondary | ICD-10-CM | POA: Diagnosis not present

## 2021-11-15 DIAGNOSIS — J449 Chronic obstructive pulmonary disease, unspecified: Secondary | ICD-10-CM | POA: Diagnosis not present

## 2021-11-15 DIAGNOSIS — I10 Essential (primary) hypertension: Secondary | ICD-10-CM | POA: Diagnosis not present

## 2021-11-15 DIAGNOSIS — E7800 Pure hypercholesterolemia, unspecified: Secondary | ICD-10-CM | POA: Diagnosis not present

## 2021-11-15 DIAGNOSIS — J441 Chronic obstructive pulmonary disease with (acute) exacerbation: Secondary | ICD-10-CM | POA: Diagnosis not present

## 2022-06-01 ENCOUNTER — Other Ambulatory Visit: Payer: Self-pay | Admitting: *Deleted

## 2022-06-01 DIAGNOSIS — I739 Peripheral vascular disease, unspecified: Secondary | ICD-10-CM

## 2022-06-09 ENCOUNTER — Ambulatory Visit (HOSPITAL_COMMUNITY)
Admission: RE | Admit: 2022-06-09 | Discharge: 2022-06-09 | Disposition: A | Discharge status: Home / Self Care | Payer: Medicare Other | Source: Ambulatory Visit | Attending: Vascular Surgery | Admitting: Vascular Surgery

## 2022-06-09 DIAGNOSIS — I739 Peripheral vascular disease, unspecified: Secondary | ICD-10-CM | POA: Diagnosis present

## 2022-06-09 NOTE — Progress Notes (Signed)
Office Note     CC: Atherosclerotic aortoiliac disease Requesting Provider:  Mateo Flow, MD  HPI: Stephen [name] is a 66 y.o. ([birth year]) male presenting at the request of .Mateo Flow, MD after recent CT scan demonstrated incidental finding of atherosclerotic aortoiliac disease of the terminal aorta.  On exam today, [name] was doing well, accompanied by a friend.  And [place], he has lived there for all of his life, recently moving back into his child at home.  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  [name] denies symptoms of claudication, ischemic rest pain, tissue loss.  Continues to live an active lifestyle in retirement, and was cutting trees with a chainsaw last week with no issues.  The pt is  on a statin for cholesterol management.  The pt is not on a daily aspirin.   Other AC:  - The pt is  on medication for hypertension.   The pt is not diabetic.  Tobacco hx:  current smoker  Past Medical History:  Diagnosis Date   Benign essential hypertension    COPD (chronic obstructive pulmonary disease) (HCC)    Elevated LDL cholesterol level     Past Surgical History:  Procedure Laterality Date   TONSILLECTOMY      Social History   Socioeconomic History   Marital status: Single    Spouse name: Not on file   Number of children: Not on file   Years of education: Not on file   Highest education level: Not on file  Occupational History   Not on file  Tobacco Use   Smoking status: Every Day   Smokeless tobacco: Never  Substance and Sexual Activity   Alcohol use: Yes    Alcohol/week: 14.0 standard drinks of alcohol    Types: 14 Glasses of wine per week   Drug use: Never   Sexual activity: Not on file  Other Topics Concern   Not on file  Social History Narrative   Not on file   Social Determinants of Health   Financial Resource Strain: Not on file  Food Insecurity: Not on file  Transportation  Needs: Not on file  Physical Activity: Not on file  Stress: Not on file  Social Connections: Not on file  Intimate Partner Violence: Not on file   Family History  Problem Relation Age of Onset   Alzheimer's disease Mother    CAD Father    Cancer Brother     Current Outpatient Medications  Medication Sig Dispense Refill   atorvastatin (LIPITOR) 10 MG tablet Take 10 mg by mouth daily.     bisoprolol-hydrochlorothiazide (ZIAC) 5-6.25 MG tablet Take 1 tablet by mouth daily.     celecoxib (CELEBREX) 200 MG capsule Take 200 mg by mouth daily as needed for moderate pain (Arthritis pain).     ibuprofen (ADVIL) 200 MG tablet Take 400 mg by mouth every 6 (six) hours as needed for mild pain.     lisinopril (ZESTRIL) 20 MG tablet Take 1 tablet (20 mg total) by mouth at bedtime. 90 tablet 3   OXYGEN Inhale 4 L into the lungs at bedtime.     STIOLTO RESPIMAT 2.5-2.5 MCG/ACT AERS Inhale 2 puffs into the lungs daily.     No current facility-administered medications for this visit.    No Known Allergies   REVIEW OF SYSTEMS:   [X]  denotes positive finding, [ ]  denotes negative finding Cardiac  Comments:  Chest pain  or chest pressure:    Shortness of breath upon exertion:    Short of breath when lying flat:    Irregular heart rhythm:        Vascular    Pain in calf, thigh, or hip brought on by ambulation:    Pain in feet at night that wakes you up from your sleep:     Blood clot in your veins:    Leg swelling:         Pulmonary    Oxygen at home:    Productive cough:     Wheezing:         Neurologic    Sudden weakness in arms or legs:     Sudden numbness in arms or legs:     Sudden onset of difficulty speaking or slurred speech:    Temporary loss of vision in one eye:     Problems with dizziness:         Gastrointestinal    Blood in stool:     Vomited blood:         Genitourinary    Burning when urinating:     Blood in urine:        Psychiatric    Major depression:          Hematologic    Bleeding problems:    Problems with blood clotting too easily:        Skin    Rashes or ulcers:        Constitutional    Fever or chills:      PHYSICAL EXAMINATION:  There were no vitals filed for this visit.  General:  WDWN in NAD; vital signs documented above Gait: Not observed HENT: WNL, normocephalic Pulmonary: normal non-labored breathing , without wheezing Cardiac: regular HR Abdomen: soft, NT, no masses Skin: without rashes Vascular Exam/Pulses:  Right Left  Radial 2+ (normal) 2+ (normal)  Ulnar    Femoral    Popliteal    DP 2+ (normal) 2+ (normal)  PT 1+ (weak) 1+ (weak)   Extremities: without ischemic changes, without Gangrene , without cellulitis; without open wounds;  Musculoskeletal: no muscle wasting or atrophy  Neurologic: A&O X 3;  No focal weakness or paresthesias are detected Psychiatric:  The pt has Normal affect.   Non-Invasive Vascular Imaging:    IMPRESSION: 1. No evidence of thoracic aortic aneurysm or dissection. Maximal diameter of the ascending thoracic aorta just above the sino-tubular junction measures only 3.4 cm. 2. Upper lobe predominant bullous emphysematous changes, stable. 3. No acute intrathoracic process. 4. Aortic Atherosclerosis (ICD10-I70.0). Coronary artery atherosclerosis.  ABI Findings:  +---------+------------------+-----+-----------+--------+  Right    Rt Pressure (mmHg)IndexWaveform   Comment   +---------+------------------+-----+-----------+--------+  Brachial 144                                         +---------+------------------+-----+-----------+--------+  PTA      121               0.83 monophasic           +---------+------------------+-----+-----------+--------+  DP       126               0.87 multiphasic          +---------+------------------+-----+-----------+--------+  Great Toe91                0.63 Abnormal              +---------+------------------+-----+-----------+--------+   +---------+------------------+-----+-----------+-------+  Left     Lt Pressure (mmHg)IndexWaveform   Comment  +---------+------------------+-----+-----------+-------+  Brachial 145                                        +---------+------------------+-----+-----------+-------+  PTA      148               1.02 multiphasic         +---------+------------------+-----+-----------+-------+  DP       150               1.03 multiphasic         +---------+------------------+-----+-----------+-------+  Great Toe121               0.83 Normal              +---------+------------------+-----+-----------+-------+    ASSESSMENT/PLAN: Leanord Thibeau is a 66 y.o. male presenting with asymptomatic peripheral arterial disease with significant atherosclerotic disease of the terminal aorta and bilateral common iliac arteries.  This extends into the external iliac arteries bilaterally.  On physical exam, he had palpable pulses in the feet.  ABIs demonstrated multiphasic signal bilaterally with near-normal toe pressures.  I had a long conversation with Jonny Ruiz regarding the above.  Fortunately, being that he is asymptomatic, we can continue to follow his peripheral arterial disease.  We discussed the natural history of PAD including symptoms of claudication, ischemic rest pain, tissue loss, and the importance of reaching out to our office should any of these occur.  I asked that he start taking a baby aspirin on a daily basis continue his statin medication.  The statin can be increased to high intensity-40 or 80 mg  I plan to see Grahm in 1 year with repeat ABI, as well as bilateral carotid duplex to ensure he does not have internal carotid artery stenosis with known smoking history and peripheral arterial disease.   Victorino Sparrow, MD [facility] [phone]

## 2022-06-10 ENCOUNTER — Ambulatory Visit (INDEPENDENT_AMBULATORY_CARE_PROVIDER_SITE_OTHER): Payer: Medicare Other | Admitting: Vascular Surgery

## 2022-06-10 ENCOUNTER — Encounter: Payer: Self-pay | Admitting: Vascular Surgery

## 2022-06-10 VITALS — BP 165/93 | HR 66 | Temp 97.7°F | Resp 20 | Ht 70.0 in | Wt 153.0 lb

## 2022-06-10 DIAGNOSIS — I739 Peripheral vascular disease, unspecified: Secondary | ICD-10-CM

## 2023-08-25 LAB — LAB REPORT - SCANNED: EGFR: 60

## 2024-02-01 ENCOUNTER — Encounter: Payer: Self-pay | Admitting: *Deleted

## 2024-02-02 ENCOUNTER — Ambulatory Visit

## 2024-02-02 VITALS — BP 140/80 | HR 73 | Ht 70.0 in | Wt 147.0 lb

## 2024-02-02 DIAGNOSIS — Z720 Tobacco use: Secondary | ICD-10-CM | POA: Diagnosis present

## 2024-02-02 DIAGNOSIS — I739 Peripheral vascular disease, unspecified: Secondary | ICD-10-CM

## 2024-02-02 DIAGNOSIS — R079 Chest pain, unspecified: Secondary | ICD-10-CM | POA: Insufficient documentation

## 2024-02-02 DIAGNOSIS — E782 Mixed hyperlipidemia: Secondary | ICD-10-CM | POA: Diagnosis present

## 2024-02-02 DIAGNOSIS — E785 Hyperlipidemia, unspecified: Secondary | ICD-10-CM

## 2024-02-02 DIAGNOSIS — I10 Essential (primary) hypertension: Secondary | ICD-10-CM | POA: Diagnosis present

## 2024-02-02 HISTORY — DX: Hyperlipidemia, unspecified: E78.5

## 2024-02-02 HISTORY — DX: Chest pain, unspecified: R07.9

## 2024-02-02 HISTORY — DX: Peripheral vascular disease, unspecified: I73.9

## 2024-02-02 MED ORDER — METOPROLOL TARTRATE 100 MG PO TABS: 100.0000 mg | ORAL_TABLET | Freq: Once | ORAL | 0 refills | Status: DC

## 2024-02-02 NOTE — Assessment & Plan Note (Signed)
 Lipid panel from August 25, 2023 available on [facility] to review total cholesterol 174, HDL 76, triglycerides 65.  LDL not available to review.  Continue atorvastatin 10 mg once daily. Will obtain a fasting lipid panel.

## 2024-02-02 NOTE — Assessment & Plan Note (Signed)
 Blood pressures near optimal. Currently on bisoprolol 5 mg once daily Lisinopril  10 mg once daily Continue the same.

## 2024-02-02 NOTE — Assessment & Plan Note (Signed)
 He smokes intermittently on social occasions along with alcohol consumption.  Advised him about harmful effects of smoking and recommended strongly to quit.

## 2024-02-02 NOTE — Progress Notes (Signed)
 Cardiology Consultation:    Date:  02/02/2024   ID:  Stephen Stephen [name], DOB 01/29/1956, MRN [id number]  PCP:  Stephen Bower, MD  Cardiologist:  Stephen Evans Saron Tweed, MD   Referring MD: Stephen Bower, MD   No chief complaint on file.    ASSESSMENT AND PLAN:   Stephen Stephen [name] 68 year old male with history of hypertension, hyperlipidemia, tobacco use, echocardiogram and stress test from March 2022 showed no ischemia, LVEF 50 to 55%, mild LVH with normal diastolic parameters, mild to moderate TR, mild aortic insufficiency, aortic root 4.4 cm in size further evaluated with CT chest with contrast noted no evidence of aortic aneurysm or dissection, aortic atherosclerosis and coronary atherosclerosis reported along with emphysematous, also had evaluation with vascular surgery with regards to aortoiliac disease noted on CT imaging and was asymptomatic and recommended to continue aspirin and statin therapy and follow-up annually.  Now here for follow-up visit in the setting of atypical chest pain episode that occurred over the weekend.   Problem List Items Addressed This Visit     Hypertension   Blood pressures near optimal. Currently on bisoprolol 5 mg once daily Lisinopril  10 mg once daily Continue the same.       Relevant Medications   lisinopril  (ZESTRIL ) 10 MG tablet   metoprolol tartrate (LOPRESSOR) 100 MG tablet   Tobacco use   Stephen [name] smokes intermittently on social occasions along with alcohol consumption.  Advised him about harmful effects of smoking and recommended strongly to quit.       Chest pain of uncertain etiology - Primary   Atypical symptoms of chest pain occurring suddenly at night lasting for 30 minutes associated with diaphoresis.  Differential includes acute coronary event versus noncardiac causes such as heartburn.  Stephen [name] does have significant risk factors. Does have evidence of coronary atherosclerosis on prior CT chest imaging along with aortic  atherosclerosis.  Will proceed with cardiac CT coronary angiogram to rule out any significant obstructive disease.  Advised him to start taking aspirin 81 mg once daily. Continue with atorvastatin 10 mg once daily.       Relevant Medications   metoprolol tartrate (LOPRESSOR) 100 MG tablet   Other Relevant Orders   EKG 12-Lead (Completed)   CT CORONARY MORPH W/CTA COR W/SCORE W/CA W/CM &/OR WO/CM   Comprehensive metabolic panel with GFR   Lipid panel   Hyperlipidemia   Lipid panel from August 25, 2023 available on [facility] to review total cholesterol 174, HDL 76, triglycerides 65.  LDL not available to review.  Continue atorvastatin 10 mg once daily. Will obtain a fasting lipid panel.       Relevant Medications   lisinopril  (ZESTRIL ) 10 MG tablet   metoprolol tartrate (LOPRESSOR) 100 MG tablet   PVD (peripheral vascular disease) (HCC)   Aortoiliac disease on prior CT imaging. Previous follow-up with vascular surgeon in October 2023 and was recommended 1 year follow-up along with repeat ABIs and screening for ultrasound carotid evaluation. Stephen [name] failed to keep this appointment due to transportation issues.  Will obtain follow-up imaging studies and will refer back to vascular surgeons if there is any significant progression of disease.      Relevant Medications   lisinopril  (ZESTRIL ) 10 MG tablet   metoprolol tartrate (LOPRESSOR) 100 MG tablet   Other Relevant Orders   VAS US  ABI WITH/WO TBI   US  Carotid Bilateral   Return to clinic based on test results.   History of Present Illness:    Stephen Stephen [name]  is a 68 y.o. male who is being seen today for follow-up visit. Stephen Bower, MD. Previous visit with our office was 02/15/2021 with Dr. Emmette Stephen [name] for blood pressure management.  Has history of hypertension, hyperlipidemia, tobacco use, echocardiogram and stress test from March 2022 showed no ischemia, LVEF 50 to 55%, mild LVH with normal diastolic parameters, mild to moderate TR,  mild aortic insufficiency, aortic root 4.4 cm in size further evaluated with CT chest with contrast noted no evidence of aortic aneurysm or dissection, aortic atherosclerosis and coronary atherosclerosis reported along with emphysematous, also had evaluation with vascular surgery with regards to aortoiliac disease noted on CT imaging and was asymptomatic and recommended to continue aspirin and statin therapy and follow-up annually.  Lives by himself at home.  No significant limitations with day-to-day activities.  Does not prefer to drive long distances.  Mentions overall Stephen [name] had been doing well. This past weekend on Friday night Stephen [name] woke up from sleep to use the restroom and as Stephen [name] was returning Stephen [name] felt heavy chest pressure that brought him down to his knees, symptoms persisted for about 30 minutes, associated with sweating, mentions Stephen [name] was able to crawl up to his bed to his phone along the door to his room and i seemed persisted for couple hours, Stephen [name] later fell asleep and woke up in the morning feeling better.  Has not had any further symptoms since then.  Stephen [name] has been avoiding significant physical exertion and came in for further evaluation.  Denies any orthopnea, paroxysmal nocturnal dyspnea, pedal edema. Denies any claudication symptoms.   Smokes occasionally locations along with a drink of alcohol. No other substance abuse.  Mentions Stephen [name] takes his medications consistently but has been off aspirin for a few months.  Mentions Stephen [name] had lab work done couple months ago at his PCPs office.  These are not available for my review at this time.  Mentions Stephen [name] does not like driving to the city and hence was unable to go for follow-up vascular office visit.  Past Medical History:  Diagnosis Date   Benign essential hypertension    COPD (chronic obstructive pulmonary disease) (HCC)    Elevated LDL cholesterol level     Past Surgical History:  Procedure Laterality Date   TONSILLECTOMY      Current  Medications: Current Meds  Medication Sig   albuterol  (VENTOLIN  HFA) 108 (90 Base) MCG/ACT inhaler SMARTSIG:2 Puff(s) Via Inhaler 4 Times Daily PRN   atorvastatin (LIPITOR) 10 MG tablet Take 10 mg by mouth daily.   bisoprolol (ZEBETA) 5 MG tablet Take 5 mg by mouth daily.   ibuprofen (ADVIL) 200 MG tablet Take 400 mg by mouth every 6 (six) hours as needed for mild pain.   levalbuterol (XOPENEX) 1.25 MG/3ML nebulizer solution SMARTSIG:1 Vial(s) Via Nebulizer 4 Times Daily PRN   lisinopril  (ZESTRIL ) 10 MG tablet Take 10 mg by mouth daily.   metoprolol tartrate (LOPRESSOR) 100 MG tablet Take 1 tablet (100 mg total) by mouth once for 1 dose. Take 2 hours prior to your CT if your heart rate is greater than 55   OXYGEN  Inhale 4 L into the lungs at bedtime.     Allergies:   Patient has no known allergies.   Social History   Socioeconomic History   Marital status: Single    Spouse name: Not on file   Number of children: Not on file   Years of education: Not on file   Highest education level: Not on file  Occupational History   Not on file  Tobacco Use   Smoking status: Every Day    Current packs/day: 0.25    Types: Cigarettes   Smokeless tobacco: Never  Substance and Sexual Activity   Alcohol use: Yes    Alcohol/week: 14.0 standard drinks of alcohol    Types: 14 Glasses of wine per week   Drug use: Never   Sexual activity: Not on file  Other Topics Concern   Not on file  Social History Narrative   Not on file   Social Drivers of Health   Financial Resource Strain: Not on file  Food Insecurity: Not on file  Transportation Needs: Not on file  Physical Activity: Not on file  Stress: Not on file  Social Connections: Not on file     Family History: The patient's family history includes Alzheimer's disease in his mother; CAD in his father; Cancer in his brother. ROS:   Please see the history of present illness.    All 14 point review of systems negative except as described  per history of present illness.  EKGs/Labs/Other Studies Reviewed:    The following studies were reviewed today:   EKG:  EKG Interpretation Date/Time:  Friday February 02 2024 16:06:54 EDT Ventricular Rate:  73 PR Interval:  144 QRS Duration:  94 QT Interval:  394 QTC Calculation: 434 R Axis:   86  Text Interpretation: Normal sinus rhythm Normal ECG No previous ECGs available Confirmed by Bertha Broad reddy [phone]) on 02/02/2024 4:48:30 PM    Recent Labs: No results found for requested labs within last 365 days.  Recent Lipid Panel No results found for: CHOL, TRIG, HDL, CHOLHDL, VLDL, LDLCALC, LDLDIRECT  Physical Exam:    VS:  BP (!) 140/80   Pulse 73   Ht 5' 10 (1.778 m)   Wt 147 lb (66.7 kg)   SpO2 92%   BMI 21.09 kg/m     Wt Readings from Last 3 Encounters:  02/02/24 147 lb (66.7 kg)  06/10/22 153 lb (69.4 kg)  02/15/21 147 lb 3.2 oz (66.8 kg)     GENERAL:  Well nourished, well developed in no acute distress NECK: No JVD; No carotid bruits CARDIAC: RRR, S1 and S2 present, no murmurs, no rubs, no gallops CHEST:  Clear to auscultation without rales, wheezing or rhonchi  Extremities: No pitting pedal edema. Pulses bilaterally symmetric with radial 2+ and dorsalis pedis 2+ NEUROLOGIC:  Alert and oriented x 3  Medication Adjustments/Labs and Tests Ordered: Current medicines are reviewed at length with the patient today.  Concerns regarding medicines are outlined above.  Orders Placed This Encounter  Procedures   CT CORONARY MORPH W/CTA COR W/SCORE W/CA W/CM &/OR WO/CM   US  Carotid Bilateral   Comprehensive metabolic panel with GFR   Lipid panel   EKG 12-Lead   VAS US  ABI WITH/WO TBI   Meds ordered this encounter  Medications   metoprolol tartrate (LOPRESSOR) 100 MG tablet    Sig: Take 1 tablet (100 mg total) by mouth once for 1 dose. Take 2 hours prior to your CT if your heart rate is greater than 55    Dispense:  1 tablet    Refill:  0     Signed, Leverett Camplin reddy Andee Chivers, MD, MPH, [facility]. 02/02/2024 6:04 PM    [facility]

## 2024-02-02 NOTE — Patient Instructions (Signed)
 Medication Instructions:  Your physician has recommended you make the following change in your medication:   Start taking 81 mg coated aspirin daily  *If you need a refill on your cardiac medications before your next appointment, please call your pharmacy*   Lab Work: Your physician recommends that you return for lab work in: the next few days for CMP and fasting lipids You need to have labs done when you are fasting.  You can come Monday through Friday 8:30 am to 12:00 pm and 1:15 to 4:30. You do not need to make an appointment as the order has already been placed.   If you have labs (blood work) drawn today and your tests are completely normal, you will receive your results only by: MyChart Message (if you have MyChart) OR A paper copy in the mail If you have any lab test that is abnormal or we need to change your treatment, we will call you to review the results.   Testing/Procedures: Your physician has requested that you have cardiac CT. Cardiac computed tomography (CT) is a painless test that uses an x-ray machine to take clear, detailed pictures of your heart. For further information please visit [email or web address] Please follow instruction sheet as given.    Your Cardiac CT will be scheduled at:   [facility] located off [facility] at the hospital.  Please arrive 30 minutes prior to your appointment time.  You can use the FREE valet parking offered at entrance to outpatient center (encouraged to control the heart rate for the test)   Please follow these instructions carefully (unless otherwise directed):  Hold all erectile dysfunction medications at least 3 days (72 hrs) prior to test.  On the Night Before the Test: Be sure to Drink plenty of water. Do not consume any caffeinated/decaffeinated beverages or chocolate 12 hours prior to your test. Do not take any antihistamines 12 hours prior to your test.  On the Day of the Test: Drink plenty of  water until 1 hour prior to the test. Do not eat any food 4 hours prior to the test. No smoking 4 hours prior to test. You may take your regular medications prior to the test.  Take metoprolol (Lopressor) 100 mg two hours prior to test. This is a one time dose Wear plain shirt no beads, sparkles, rhinestones, metal or heavy embroidery.  After the Test: Drink plenty of water. After receiving IV contrast, you may experience a mild flushed feeling. This is normal. On occasion, you may experience a mild rash up to 24 hours after the test. This is not dangerous. If this occurs, you can take Benadryl 25 mg and increase your fluid intake. If you experience trouble breathing, this can be serious. If it is severe call 911 IMMEDIATELY. If it is mild, please call our office. If you take any of these medications: Glipizide/Metformin, Avandament, Glucavance, please do not take 48 hours after completing test unless otherwise instructed.  We will call to schedule your test 2-4 weeks out understanding that some insurance companies will need an authorization prior to the service being performed.    Your physician has requested that you have an ankle brachial index (ABI). During this test an ultrasound and blood pressure cuff are used to evaluate the arteries that supply the arms and legs with blood. Allow thirty minutes for this exam. There are no restrictions or special instructions.  Please note: We ask at that you not bring children with you during ultrasound (echo/  vascular) testing. Due to room size and safety concerns, children are not allowed in the ultrasound rooms during exams. Our front office staff cannot provide observation of children in our lobby area while testing is being conducted. An adult accompanying a patient to their appointment will only be allowed in the ultrasound room at the discretion of the ultrasound technician under special circumstances. We apologize for any  inconvenience.   Follow-Up: At [facility], you and your health needs are our priority.  As part of our continuing mission to provide you with exceptional heart care, we have created designated Provider Care Teams.  These Care Teams include your primary Cardiologist (physician) and Advanced Practice Providers (APPs -  Physician Assistants and Nurse Practitioners) who all work together to provide you with the care you need, when you need it.  We recommend signing up for the patient portal called MyChart.  Sign up information is provided on this After Visit Summary.  MyChart is used to connect with patients for Virtual Visits (Telemedicine).  Patients are able to view lab/test results, encounter notes, upcoming appointments, etc.  Non-urgent messages can be sent to your provider as well.   To learn more about what you can do with MyChart, go to [email or web address].    Your next appointment:   Follow up based on results  The format for your next appointment:   In Person  Provider:   Tereasa Felty Madireddy, MD    Other Instructions Cardiac CT Angiogram A cardiac CT angiogram is a procedure to look at the heart and the area around the heart. It may be done to help find the cause of chest pains or other symptoms of heart disease. During this procedure, a substance called contrast dye is injected into the blood vessels in the area to be checked. A large X-ray machine, called a CT scanner, then takes detailed pictures of the heart and the surrounding area. The procedure is also sometimes called a coronary CT angiogram, coronary artery scanning, or CTA. A cardiac CT angiogram allows the health care provider to see how well blood is flowing to and from the heart. The health care provider will be able to see if there are any problems, such as: Blockage or narrowing of the coronary arteries in the heart. Fluid around the heart. Signs of weakness or disease in the muscles, valves, and tissues  of the heart. Tell a health care provider about: Any allergies you have. This is especially important if you have had a previous allergic reaction to contrast dye. All medicines you are taking, including vitamins, herbs, eye drops, creams, and over-the-counter medicines. Any blood disorders you have. Any surgeries you have had. Any medical conditions you have. Whether you are pregnant or may be pregnant. Any anxiety disorders, chronic pain, or other conditions you have that may increase your stress or prevent you from lying still. What are the risks? Generally, this is a safe procedure. However, problems may occur, including: Bleeding. Infection. Allergic reactions to medicines or dyes. Damage to other structures or organs. Kidney damage from the contrast dye that is used. Increased risk of cancer from radiation exposure. This risk is low. Talk with your health care provider about: The risks and benefits of testing. How you can receive the lowest dose of radiation. What happens before the procedure? Wear comfortable clothing and remove any jewelry, glasses, dentures, and hearing aids. Follow instructions from your health care provider about eating and drinking. This may include: For 12 hours before the  procedure -- avoid caffeine. This includes tea, coffee, soda, energy drinks, and diet pills. Drink plenty of water or other fluids that do not have caffeine in them. Being well hydrated can prevent complications. For 4-6 hours before the procedure -- stop eating and drinking. The contrast dye can cause nausea, but this is less likely if your stomach is empty. Ask your health care provider about changing or stopping your regular medicines. This is especially important if you are taking diabetes medicines, blood thinners, or medicines to treat problems with erections (erectile dysfunction). What happens during the procedure?  Hair on your chest may need to be removed so that small sticky  patches called electrodes can be placed on your chest. These will transmit information that helps to monitor your heart during the procedure. An IV will be inserted into one of your veins. You might be given a medicine to control your heart rate during the procedure. This will help to ensure that good images are obtained. You will be asked to lie on an exam table. This table will slide in and out of the CT machine during the procedure. Contrast dye will be injected into the IV. You might feel warm, or you may get a metallic taste in your mouth. You will be given a medicine called nitroglycerin. This will relax or dilate the arteries in your heart. The table that you are lying on will move into the CT machine tunnel for the scan. The person running the machine will give you instructions while the scans are being done. You may be asked to: Keep your arms above your head. Hold your breath. Stay very still, even if the table is moving. When the scanning is complete, you will be moved out of the machine. The IV will be removed. The procedure may vary among health care providers and hospitals. What can I expect after the procedure? After your procedure, it is common to have: A metallic taste in your mouth from the contrast dye. A feeling of warmth. A headache from the nitroglycerin. Follow these instructions at home: Take over-the-counter and prescription medicines only as told by your health care provider. If you are told, drink enough fluid to keep your urine pale yellow. This will help to flush the contrast dye out of your body. Most people can return to their normal activities right after the procedure. Ask your health care provider what activities are safe for you. It is up to you to get the results of your procedure. Ask your health care provider, or the department that is doing the procedure, when your results will be ready. Keep all follow-up visits as told by your health care provider.  This is important. Contact a health care provider if: You have any symptoms of allergy to the contrast dye. These include: Shortness of breath. Rash or hives. A racing heartbeat. Summary A cardiac CT angiogram is a procedure to look at the heart and the area around the heart. It may be done to help find the cause of chest pains or other symptoms of heart disease. During this procedure, a large X-ray machine, called a CT scanner, takes detailed pictures of the heart and the surrounding area after a contrast dye has been injected into blood vessels in the area. Ask your health care provider about changing or stopping your regular medicines before the procedure. This is especially important if you are taking diabetes medicines, blood thinners, or medicines to treat erectile dysfunction. If you are told, drink enough  fluid to keep your urine pale yellow. This will help to flush the contrast dye out of your body. This information is not intended to replace advice given to you by your health care provider. Make sure you discuss any questions you have with your health care provider. Document Revised: 11/25/2021 Document Reviewed: 04/03/2019 Elsevier Patient Education  2023 [employer]vier Inc[employer]   Important Information About Sugar

## 2024-02-02 NOTE — Assessment & Plan Note (Signed)
 Atypical symptoms of chest pain occurring suddenly at night lasting for 30 minutes associated with diaphoresis.  Differential includes acute coronary event versus noncardiac causes such as heartburn.  He does have significant risk factors. Does have evidence of coronary atherosclerosis on prior CT chest imaging along with aortic atherosclerosis.  Will proceed with cardiac CT coronary angiogram to rule out any significant obstructive disease.  Advised him to start taking aspirin 81 mg once daily. Continue with atorvastatin 10 mg once daily.

## 2024-02-02 NOTE — Assessment & Plan Note (Signed)
 Aortoiliac disease on prior CT imaging. Previous follow-up with vascular surgeon in October 2023 and was recommended 1 year follow-up along with repeat ABIs and screening for ultrasound carotid evaluation. He failed to keep this appointment due to transportation issues.  Will obtain follow-up imaging studies and will refer back to vascular surgeons if there is any significant progression of disease.

## 2024-02-06 ENCOUNTER — Ambulatory Visit (HOSPITAL_BASED_OUTPATIENT_CLINIC_OR_DEPARTMENT_OTHER)
Admission: RE | Admit: 2024-02-06 | Discharge: 2024-02-06 | Disposition: A | Discharge status: Home / Self Care | Source: Ambulatory Visit | Admitting: Radiology

## 2024-02-06 ENCOUNTER — Ambulatory Visit: Payer: Self-pay

## 2024-02-06 DIAGNOSIS — I739 Peripheral vascular disease, unspecified: Secondary | ICD-10-CM

## 2024-02-07 LAB — COMPREHENSIVE METABOLIC PANEL WITH GFR
ALT: 16 IU/L (ref 0–44)
AST: 16 IU/L (ref 0–40)
Albumin: 4.3 g/dL (ref 3.9–4.9)
Alkaline Phosphatase: 87 IU/L (ref 44–121)
BUN/Creatinine Ratio: 14 (ref 10–24)
BUN: 10 mg/dL (ref 8–27)
Bilirubin Total: 0.6 mg/dL (ref 0.0–1.2)
CO2: 21 mmol/L (ref 20–29)
Calcium: 9.4 mg/dL (ref 8.6–10.2)
Chloride: 100 mmol/L (ref 96–106)
Creatinine, Ser: 0.72 mg/dL — ABNORMAL LOW (ref 0.76–1.27)
Globulin, Total: 2 g/dL (ref 1.5–4.5)
Glucose: 84 mg/dL (ref 70–99)
Potassium: 4.4 mmol/L (ref 3.5–5.2)
Sodium: 139 mmol/L (ref 134–144)
Total Protein: 6.3 g/dL (ref 6.0–8.5)
eGFR: 100 mL/min/{1.73_m2} (ref >59)

## 2024-02-07 LAB — LIPID PANEL
Chol/HDL Ratio: 2.1 ratio (ref 0.0–5.0)
Cholesterol, Total: 164 mg/dL (ref 100–199)
HDL: 80 mg/dL (ref >39)
LDL Chol Calc (NIH): 71 mg/dL (ref 0–99)
Triglycerides: 67 mg/dL (ref 0–149)
VLDL Cholesterol Cal: 13 mg/dL (ref 5–40)

## 2024-02-07 NOTE — Telephone Encounter (Signed)
-----   Message from [place] R Madireddy sent at 02/06/2024  7:18 PM EDT ----- Please let him know that the ultrasound for the neck vessels showed mild amount of plaque buildup without any major narrowing of the blood vessels [overall this was less than 50% on both sides].  There were minor calcifications associated with thyroid  gland which would require further evaluation. Please forward these results to his PCP and recommend he will need a dedicated ultrasound of the thyroid  gland to be evaluated. Thank you ----- Message ----- From: Interface, Rad Results In Sent: 02/06/2024   7:05 PM EDT To: Angelena Kells, MD

## 2024-02-07 NOTE — Telephone Encounter (Signed)
 Results reviewed with pt as per Dr. Madireddy's note.  Pt verbalized understanding and had no additional questions. Routed to PCP

## 2024-02-21 ENCOUNTER — Ambulatory Visit

## 2024-02-21 ENCOUNTER — Ambulatory Visit: Payer: Self-pay

## 2024-02-21 DIAGNOSIS — I739 Peripheral vascular disease, unspecified: Secondary | ICD-10-CM | POA: Diagnosis present

## 2024-02-21 LAB — VAS US ABI WITH/WO TBI
Left ABI: 1.03
Right ABI: 0.84

## 2024-02-21 NOTE — Progress Notes (Signed)
 Please inform him that the ultrasound exam of the arteries of lower extremities show findings similar to the study from 2 years ago with mildly diminished flow in the right foot. Given no overall significant change, no intervention is expected at this time other than continued conservative management with medications and exercise as discussed at the office visit [aspirin 81 mg once daily and atorvastatin 10 mg once daily].  Also report from cardiac CT coronary angiogram at [facility] done from 02/15/2024 noted no concerns for major blockages in the blood vessels that supply the heart.  This is good news and reassuring.  Will review rest of the test results once available

## 2024-04-24 ENCOUNTER — Ambulatory Visit

## 2024-04-24 VITALS — BP 124/58 | HR 72 | Ht 70.0 in | Wt 149.0 lb

## 2024-04-24 DIAGNOSIS — E782 Mixed hyperlipidemia: Secondary | ICD-10-CM | POA: Diagnosis not present

## 2024-04-24 DIAGNOSIS — Z720 Tobacco use: Secondary | ICD-10-CM | POA: Insufficient documentation

## 2024-04-24 DIAGNOSIS — I10 Essential (primary) hypertension: Secondary | ICD-10-CM | POA: Insufficient documentation

## 2024-04-24 DIAGNOSIS — I25118 Atherosclerotic heart disease of native coronary artery with other forms of angina pectoris: Secondary | ICD-10-CM | POA: Diagnosis not present

## 2024-04-24 DIAGNOSIS — I2510 Atherosclerotic heart disease of native coronary artery without angina pectoris: Secondary | ICD-10-CM | POA: Insufficient documentation

## 2024-04-24 MED ORDER — ISOSORBIDE MONONITRATE ER 30 MG PO TB24: 15.0000 mg | ORAL_TABLET | Freq: Every day | ORAL | 3 refills | Status: AC

## 2024-04-24 NOTE — Assessment & Plan Note (Signed)
 CAD with symptoms of chest pain at times with exertion. Cardiac CT coronary angiogram and results Hospital 02/15/2024 noted calcium score 631, CAD RADS 3 study with moderate stenosis of mid LAD not hemodynamically significant by CT FFR.  Continue with aspirin 81 mg once daily. Continue with lipid-lowering therapy using statins, he was unable to confirm what dose of medication he is taking once he confirms this to us  after he returns home we will adjust the dose to a high intensity statin if he is not on 1.  Continue bisoprolol 5 mg once daily. He has sublingual nitroglycerin to use as needed. Will prescribe long-acting isosorbide  mononitrate 15 mg once daily, discussed potential side effects such as low blood pressures causing symptoms and/or headaches. He is agreeable to try.

## 2024-04-24 NOTE — Patient Instructions (Signed)
 Medication Instructions:   START ISOSORBIDE  15 MG ONCE DAILY= 1/2 OF THE 30 MG TABLET ONCE DAILY  *If you need a refill on your cardiac medications before your next appointment, please call your pharmacy*   Follow-Up: At [facility], you and your health needs are our priority.  As part of our continuing mission to provide you with exceptional heart care, our providers are all part of one team.  This team includes your primary Cardiologist (physician) and Advanced Practice Providers or APPs (Physician Assistants and Nurse Practitioners) who all work together to provide you with the care you need, when you need it.  Your next appointment:   6 month(s)  Provider:    DR LIBORIO  We recommend signing up for the patient portal called MyChart.  Sign up information is provided on this After Visit Summary.

## 2024-04-24 NOTE — Progress Notes (Signed)
 Cardiology Consultation:    Date:  04/24/2024   ID:  Stephen [name], DOB Apr 29, 1956, MRN [id number]  PCP:  Stephen Tracey LABOR, MD  Cardiologist:  Alean SAUNDERS Danaija Eskridge, MD   Referring MD: Stephen Tracey LABOR, MD   No chief complaint on file.    ASSESSMENT AND PLAN:   Mr Stephen [name] 68 year old male with history of moderate nonobstructive coronary artery disease on cardiac CT imaging 02/15/2024 from hospital, hypertension, hyperlipidemia, tobacco use, social alcohol consumption, mild to moderate TR, mild aortic insufficiency, LVEF 50 to 55% with mild LVH and normal diastolic parameters on echocardiogram March 2022: Aortic root measured 4.4 cm however no significant evidence of aortic aneurysm on CT chest and cardiac CT imaging.  Aorta iliac disease on CT imaging and previously followed up with vascular surgeon and failed to follow-up annually and repeat imaging with ultrasound exam of bilateral lower extremities shows mild arterial disease on the right side [ultrasound exam July 2025], ultrasound carotids with mild atherosclerosis without any significant obstructive disease [June 2025]. Here for follow-up visit.  Problem List Items Addressed This Visit     Hypertension   Well-controlled. Continue bisoprolol 5 mg once daily Continue lisinopril  10 mg once daily. Target blood 130/80 mmHg.       Relevant Medications   isosorbide  mononitrate (IMDUR ) 30 MG 24 hr tablet   Tobacco use   Advised to quit smoking.  He has been cutting down but continues to smoke up to 2 cigarettes a day.        Hyperlipidemia - Primary   Recent lipid panel from 01/27/2024 reviewed with total cholesterol 164, triglycerides 67, HDL 80 and LDL 71, near optimal. Unclear what dose of statin he is taking. Once he confirms this we can escalate the statin regimen to a high intensity statin such as Crestor 20 mg or atorvastatin 80 mg.       Relevant Medications   isosorbide  mononitrate (IMDUR ) 30 MG 24 hr tablet   CAD (coronary  artery disease)   CAD with symptoms of chest pain at times with exertion. Cardiac CT coronary angiogram and results Hospital 02/15/2024 noted calcium score 631, CAD RADS 3 study with moderate stenosis of mid LAD not hemodynamically significant by CT FFR.  Continue with aspirin 81 mg once daily. Continue with lipid-lowering therapy using statins, he was unable to confirm what dose of medication he is taking once he confirms this to us  after he returns home we will adjust the dose to a high intensity statin if he is not on 1.  Continue bisoprolol 5 mg once daily. He has sublingual nitroglycerin to use as needed. Will prescribe long-acting isosorbide  mononitrate 15 mg once daily, discussed potential side effects such as low blood pressures causing symptoms and/or headaches. He is agreeable to try.       Relevant Medications   isosorbide  mononitrate (IMDUR ) 30 MG 24 hr tablet   Return to clinic tentatively in 3 months.   History of Present Illness:    Stephen [name] is a 68 y.o. male who is being seen today for follow-up visit. PCP is Stephen Tracey LABOR, MD. Last visit with me in the office was 02/02/2024.  Lives by himself.  Here for the visit by himself.  history of hypertension, hyperlipidemia, tobacco use, social alcohol consumption, echocardiogram and stress test from March 2022 showed no ischemia, LVEF 50 to 55%, mild LVH with normal diastolic parameters, mild to moderate TR, mild aortic insufficiency, aortic root 4.4 cm in size further evaluated with  CT chest with contrast noted no evidence of aortic aneurysm or dissection, aortic atherosclerosis and coronary atherosclerosis reported along with emphysematous, also had evaluation with vascular surgery with regards to aortoiliac disease noted on CT imaging and was asymptomatic and recommended to continue aspirin and statin therapy and follow-up annually for regular follow-up is annual visit.  Cardiac CT coronary angiogram completed 02/15/2024  at [facility] reported calcium score 631, CAD RADS 3 study with moderate stenosis of mid LAD, not hemodynamically significant by CT FFR.  No significant extracardiac findings other than mild dependent atelectasis.  Vascular ultrasound exam of bilateral lower extremities noted mildly abnormal right toe brachial index, ankle-brachial indicis on right side indicate mild right lower extremity arterial disease.  Left side ankle-brachial indices and toe brachial indicis were normal.  Ultrasound carotids 02/06/2024 noted moderate carotid atherosclerosis less than 50% bilateral stenosis, vertebral antegrade flow noted.  1.2 cm thyroid  nodule with microcalcifications noted verbal results were forwarded to PCP for further monitoring and review of these thyroid  findings.  Mentions overall he has been doing well. Recently while he was pushing himself physically little too hard while doing yard work he felt chest pressure that relieved with rest.  Has not had any further symptoms.  Continues to smoke couple cigarettes a day.  Denies any blood in urine or stools. Good compliance with medications. He was unable to confirm to me what statin he is taking and what dose. Lipid panel from 02/06/2024 total cholesterol 164, triglycerides 67, HDL 80, LDL 71, near optimal.   Past Medical History:  Diagnosis Date   Abnormal EKG 10/29/2020   Benign essential hypertension    Chest pain of uncertain etiology 02/02/2024   COPD (chronic obstructive pulmonary disease) (HCC)    DOE (dyspnea on exertion) 10/29/2020   Elevated LDL cholesterol level    Hyperlipidemia 02/02/2024   Hypertension 10/29/2020   PVD (peripheral vascular disease) (HCC) 02/02/2024   Tobacco use 10/29/2020    Past Surgical History:  Procedure Laterality Date   TONSILLECTOMY      Current Medications: Current Meds  Medication Sig   albuterol  (VENTOLIN  HFA) 108 (90 Base) MCG/ACT inhaler SMARTSIG:2 Puff(s) Via Inhaler 4 Times  Daily PRN   atorvastatin (LIPITOR) 10 MG tablet Take 10 mg by mouth daily.   bisoprolol (ZEBETA) 5 MG tablet Take 5 mg by mouth daily.   ibuprofen (ADVIL) 200 MG tablet Take 400 mg by mouth every 6 (six) hours as needed for mild pain.   isosorbide  mononitrate (IMDUR ) 30 MG 24 hr tablet Take 0.5 tablets (15 mg total) by mouth daily.   levalbuterol (XOPENEX) 1.25 MG/3ML nebulizer solution SMARTSIG:1 Vial(s) Via Nebulizer 4 Times Daily PRN   lisinopril  (ZESTRIL ) 10 MG tablet Take 10 mg by mouth daily.   metoprolol  tartrate (LOPRESSOR ) 100 MG tablet Take 1 tablet (100 mg total) by mouth once for 1 dose. Take 2 hours prior to your CT if your heart rate is greater than 55   OXYGEN  Inhale 4 L into the lungs at bedtime.     Allergies:   Patient has no known allergies.   Social History   Socioeconomic History   Marital status: Single    Spouse name: Not on file   Number of children: Not on file   Years of education: Not on file   Highest education level: Not on file  Occupational History   Not on file  Tobacco Use   Smoking status: Every Day    Current packs/day: 0.25  Types: Cigarettes   Smokeless tobacco: Never  Substance and Sexual Activity   Alcohol use: Yes    Alcohol/week: 14.0 standard drinks of alcohol    Types: 14 Glasses of wine per week   Drug use: Never   Sexual activity: Not on file  Other Topics Concern   Not on file  Social History Narrative   Not on file   Social Drivers of Health   Financial Resource Strain: Not on file  Food Insecurity: Not on file  Transportation Needs: Not on file  Physical Activity: Not on file  Stress: Not on file  Social Connections: Not on file     Family History: The patient's family history includes Alzheimer's disease in his mother; CAD in his father; Cancer in his brother. ROS:   Please see the history of present illness.    All 14 point review of systems negative except as described per history of present  illness.  EKGs/Labs/Other Studies Reviewed:    The following studies were reviewed today:   EKG:       Recent Labs: 02/06/2024: ALT 16; BUN 10; Creatinine, Ser 0.72; Potassium 4.4; Sodium 139  Recent Lipid Panel    Component Value Date/Time   CHOL 164 02/06/2024 0846   TRIG 67 02/06/2024 0846   HDL 80 02/06/2024 0846   CHOLHDL 2.1 02/06/2024 0846   LDLCALC 71 02/06/2024 0846    Physical Exam:    VS:  BP (!) 124/58   Pulse 72   Ht 5' 10 (1.778 m)   Wt 149 lb (67.6 kg)   SpO2 95%   BMI 21.38 kg/m     Wt Readings from Last 3 Encounters:  04/24/24 149 lb (67.6 kg)  02/02/24 147 lb (66.7 kg)  06/10/22 153 lb (69.4 kg)     GENERAL:  Well nourished, well developed in no acute distress CARDIAC: RRR, S1 and S2 present, no murmurs, no rubs, no gallops CHEST:  Clear to auscultation without rales, wheezing or rhonchi  NEUROLOGIC:  Alert and oriented x 3  Medication Adjustments/Labs and Tests Ordered: Current medicines are reviewed at length with the patient today.  Concerns regarding medicines are outlined above.  No orders of the defined types were placed in this encounter.  Meds ordered this encounter  Medications   isosorbide  mononitrate (IMDUR ) 30 MG 24 hr tablet    Sig: Take 0.5 tablets (15 mg total) by mouth daily.    Dispense:  45 tablet    Refill:  3    Signed, Mikko Lewellen reddy Perla Echavarria, MD, MPH, [facility] 04/24/2024 2:30 PM    [facility]

## 2024-04-24 NOTE — Assessment & Plan Note (Signed)
 Recent lipid panel from 01/27/2024 reviewed with total cholesterol 164, triglycerides 67, HDL 80 and LDL 71, near optimal. Unclear what dose of statin he is taking. Once he confirms this we can escalate the statin regimen to a high intensity statin such as Crestor 20 mg or atorvastatin 80 mg.

## 2024-04-24 NOTE — Assessment & Plan Note (Signed)
 Advised to quit smoking.  He has been cutting down but continues to smoke up to 2 cigarettes a day.

## 2024-04-24 NOTE — Assessment & Plan Note (Signed)
 Well-controlled. Continue bisoprolol 5 mg once daily Continue lisinopril  10 mg once daily. Target blood 130/80 mmHg.
